# Patient Record
Sex: Male | Born: 1960 | Race: White | Hispanic: No | Marital: Married | Smoking: Former smoker
Health system: Southern US, Community
[De-identification: ages and names within clinical notes are randomized; demographics above are authoritative.]

## PROBLEM LIST (undated history)

## (undated) DIAGNOSIS — I10 Essential (primary) hypertension: Secondary | ICD-10-CM

## (undated) DIAGNOSIS — E559 Vitamin D deficiency, unspecified: Secondary | ICD-10-CM

## (undated) HISTORY — DX: Essential (primary) hypertension: I10

## (undated) HISTORY — PX: HIP ARTHROPLASTY: SHX981

## (undated) HISTORY — PX: KNEE ARTHROPLASTY: SHX992

## (undated) HISTORY — DX: Vitamin D deficiency, unspecified: E55.9

---

## 2001-11-19 ENCOUNTER — Inpatient Hospital Stay (HOSPITAL_COMMUNITY): Admission: EM | Admit: 2001-11-19 | Discharge: 2001-11-22 | Payer: Self-pay | Admitting: Plastic Surgery

## 2001-11-19 ENCOUNTER — Encounter: Payer: Self-pay | Admitting: Emergency Medicine

## 2001-11-19 ENCOUNTER — Encounter: Payer: Self-pay | Admitting: Orthopedic Surgery

## 2002-12-23 ENCOUNTER — Emergency Department (HOSPITAL_COMMUNITY): Admission: EM | Admit: 2002-12-23 | Discharge: 2002-12-23 | Payer: Self-pay | Admitting: *Deleted

## 2002-12-23 ENCOUNTER — Encounter: Payer: Self-pay | Admitting: *Deleted

## 2005-07-21 ENCOUNTER — Encounter (INDEPENDENT_AMBULATORY_CARE_PROVIDER_SITE_OTHER): Payer: Self-pay | Admitting: Family Medicine

## 2007-02-03 ENCOUNTER — Ambulatory Visit: Payer: Self-pay | Admitting: Family Medicine

## 2007-02-03 DIAGNOSIS — G43909 Migraine, unspecified, not intractable, without status migrainosus: Secondary | ICD-10-CM | POA: Insufficient documentation

## 2007-02-03 DIAGNOSIS — R42 Dizziness and giddiness: Secondary | ICD-10-CM | POA: Insufficient documentation

## 2007-02-03 DIAGNOSIS — R030 Elevated blood-pressure reading, without diagnosis of hypertension: Secondary | ICD-10-CM | POA: Insufficient documentation

## 2007-06-27 ENCOUNTER — Encounter (INDEPENDENT_AMBULATORY_CARE_PROVIDER_SITE_OTHER): Payer: Self-pay | Admitting: Family Medicine

## 2007-06-27 LAB — CONVERTED CEMR LAB
Cholesterol: 185 mg/dL
HDL: 40 mg/dL
LDL Cholesterol: 119 mg/dL
Triglycerides: 131 mg/dL

## 2007-07-17 ENCOUNTER — Ambulatory Visit: Payer: Self-pay | Admitting: Internal Medicine

## 2007-07-17 DIAGNOSIS — J309 Allergic rhinitis, unspecified: Secondary | ICD-10-CM | POA: Insufficient documentation

## 2007-07-28 ENCOUNTER — Ambulatory Visit: Payer: Self-pay | Admitting: Family Medicine

## 2007-07-28 LAB — CONVERTED CEMR LAB
Cholesterol, target level: 200 mg/dL
Cholesterol: 185 mg/dL
HDL goal, serum: 40 mg/dL
HDL: 40 mg/dL
LDL Cholesterol: 119 mg/dL
LDL Goal: 160 mg/dL
Triglycerides: 131 mg/dL

## 2007-07-31 ENCOUNTER — Telehealth (INDEPENDENT_AMBULATORY_CARE_PROVIDER_SITE_OTHER): Payer: Self-pay | Admitting: *Deleted

## 2007-07-31 ENCOUNTER — Encounter (INDEPENDENT_AMBULATORY_CARE_PROVIDER_SITE_OTHER): Payer: Self-pay | Admitting: Family Medicine

## 2007-08-01 ENCOUNTER — Telehealth (INDEPENDENT_AMBULATORY_CARE_PROVIDER_SITE_OTHER): Payer: Self-pay | Admitting: *Deleted

## 2007-08-01 LAB — CONVERTED CEMR LAB
ALT: 31 units/L (ref 0–53)
AST: 20 units/L (ref 0–37)
Albumin: 4.5 g/dL (ref 3.5–5.2)
Alkaline Phosphatase: 57 units/L (ref 39–117)
Bilirubin, Direct: 0.1 mg/dL (ref 0.0–0.3)
GGT: 63 units/L — ABNORMAL HIGH (ref 7–51)
Indirect Bilirubin: 0.4 mg/dL (ref 0.0–0.9)
Total Bilirubin: 0.5 mg/dL (ref 0.3–1.2)
Total Protein: 6.7 g/dL (ref 6.0–8.3)

## 2007-08-02 ENCOUNTER — Encounter (INDEPENDENT_AMBULATORY_CARE_PROVIDER_SITE_OTHER): Payer: Self-pay | Admitting: Family Medicine

## 2007-10-27 ENCOUNTER — Ambulatory Visit: Payer: Self-pay | Admitting: Family Medicine

## 2008-04-16 ENCOUNTER — Ambulatory Visit: Payer: Self-pay | Admitting: Family Medicine

## 2008-04-16 DIAGNOSIS — J069 Acute upper respiratory infection, unspecified: Secondary | ICD-10-CM | POA: Insufficient documentation

## 2008-07-26 ENCOUNTER — Telehealth (INDEPENDENT_AMBULATORY_CARE_PROVIDER_SITE_OTHER): Payer: Self-pay | Admitting: *Deleted

## 2008-12-24 ENCOUNTER — Encounter (INDEPENDENT_AMBULATORY_CARE_PROVIDER_SITE_OTHER): Payer: Self-pay | Admitting: Family Medicine

## 2010-05-07 NOTE — Progress Notes (Signed)
Summary: patinet needs Rx  Phone Note Call from Patient   Reason for Call: Talk to Nurse Summary of Call: patient is requesting a rx for nasonex to be called in. He said Dr Erby Pian said when he ran out to just call the office...  he uses [facility] in [place] if any problems please call his cell [phone] Initial call taken by: Donneta Romberg,  July 26, 2008 2:17 PM      Prescriptions: NASONEX 50 MCG/ACT  SUSP (MOMETASONE FUROATE) 2 sprays in each nostril once daily  #One month x 3   Entered by:   Sherilyn Banker LPN   Authorized by:   Franchot Heidelberg MD   Signed by:   Sherilyn Banker LPN on 09/81/1914   Method used:   Faxed to ...       [facility] [address] [phone]* (retail)       [address] [place]  [address]       Ph: [phone]       Fax: [phone]   RxID:   [id number]

## 2010-05-07 NOTE — Letter (Signed)
Summary: Results Letter  Results Letter   Imported By: Lutricia Horsfall 08/03/2007 15:03:54  _____________________________________________________________________  External Attachment:    Type:   Image     Comment:   External Document

## 2010-05-07 NOTE — Assessment & Plan Note (Signed)
Summary: FOLLOW UP 3 MONTH/SLJ   Vital Signs:  Patient Profile:   50 Years Old Male Height:     70 inches Weight:      182 pounds BMI:     26.21 O2 Sat:      99 % Temp:     97.1 degrees F Pulse rate:   82 / minute Resp:     12 per minute BP sitting:   132 / 81  Vitals Entered By: Sherilyn Banker (October 27, 2007 3:29 PM)                 PCP:  Franchot Heidelberg, MD  Chief Complaint:  follow up visit.  History of Present Illness: Pt in for recheck.  He states he has done well.   He states he has cut out Sudafed and states BP has done great. Also limiting OTC meds. He has done home readings at work with nurse and this is reviewed: - 127 - 136/ 74 - 96. He states he fee3s g66d. He denies tremor, weakness and has not had headaches. Notes no chest pain, shortness of breath. He denies palpitations, leg swelling. He is watching salt. He eats healthy. He is active and states works all the time.   He now presents.    Hypertension History:      Positive major cardiovascular risk factors include male age 78 years old or older.  Negative major cardiovascular risk factors include no history of diabetes, no history of hypertension, negative family history for ischemic heart disease, and non-tobacco-user status.        Further assessment for target organ damage reveals no history of ASHD, stroke/TIA, or peripheral vascular disease.    Lipid Management History:      Positive NCEP/ATP III risk factors include male age 72 years old or older.  Negative NCEP/ATP III risk factors include non-diabetic, no family history for ischemic heart disease, non-tobacco-user status, non-hypertensive, no ASHD (atherosclerotic heart disease), no prior stroke/TIA, no peripheral vascular disease, and no history of aortic aneurysm.        The patient states that he knows about the "Therapeutic Lifestyle Change" diet.  His compliance with the TLC diet is good.  The patient expresses understanding of adjunctive  measures for cholesterol lowering.  Adjunctive measures started by the patient include aerobic exercise, fiber, and omega-3 supplements.        Prior Medications Reviewed Using: Patient Recall  Updated Prior Medication List: NASONEX 50 MCG/ACT  SUSP (MOMETASONE FUROATE) 2 sprays in each nostril once daily  Current Allergies (reviewed today): No known allergies   Past Medical History:    Reviewed history from 07/17/2007 and no changes required:       Allergic rhinitis  Past Surgical History:    Reviewed history from 02/03/2007 and no changes required:       Hip fracture ORIF (2003) - fell of ladder - right       ACL Righ knee 1992       Hx of broken lef leg   Family History:    Reviewed history from 02/03/2007 and no changes required:       Father: alive @ 65 OA - needs hip replacement, BPH       Mother: alive @ 37 - CVA, HTN, COPD (hx of smoking), Borderline DM       Siblings: 2 sisters 63 and 34 & 1 brother 53 - HTN  Social History:    Reviewed history from 02/03/2007 and no  changes required:       Occupation: Buyer, retail       Married       Current Smoker       Alcohol use-no       Drug use-no   Risk Factors: Tobacco use:  quit    Pack-years:  2 packs per week - 15 years Drug use:  no Alcohol use:  no  Family History Risk Factors:    Family History of MI in females < 30 years old:  no    Family History of MI in males < 37 years old:  no   Review of Systems      See HPI  General      Denies chills, fever, and sweats.  CV      Denies chest pain or discomfort, swelling of feet, swelling of hands, and weight gain.  Resp      Denies cough, shortness of breath, sputum productive, and wheezing.  GI      Denies abdominal pain, constipation, diarrhea, nausea, and vomiting.  GU      Denies nocturia, urinary frequency, and urinary hesitancy.   Physical Exam  General:     alert, well-developed, and well-nourished.     Lungs:     Clear to auscultation bilaterally with good air movement, normal expansion.  Heart:     Normal rate and regular rhythm. S1 and S2 normal without gallop, murmur, click, rub or other extra sounds. Abdomen:     Bowel sounds positive,abdomen soft and non-tender without masses, organomegaly or hernias noted. Extremities:     No clubbing, cyanosis, edema, or deformity noted with normal full range of motion of all joints.   Cervical Nodes:     No lymphadenopathy noted Psych:     Cognition and judgment appear intact. Alert and cooperative with normal attention span and concentration. No apparent delusions, illusions, hallucinations    Impression & Recommendations:  Problem # 1:  ALLERGIC RHINITIS (ICD-477.9) Stable on Rx. Sampls given x 4. Update if needs more. Cont conservative measures with allegen avoidance if can, use of mask when mowing and chnaging home filters. Agrees. His updated medication list for this problem includes:    Nasonex 50 Mcg/act Susp (Mometasone furoate) .Marland Kitchen... 2 sprays in each nostril once daily   Problem # 2:  ELEVATED BLOOD PRESSURE WITHOUT DIAGNOSIS OF HYPERTENSION (ICD-796.2) Resolved. Avoid NSAIDs and Sudafed.  Problem # 3:  LIVER FUNCTION TESTS, ABNORMAL - GGT (ICD-794.8) Recheck 6 months.  Complete Medication List: 1)  Nasonex 50 Mcg/act Susp (Mometasone furoate) .... 2 sprays in each nostril once daily  Hypertension Assessment/Plan:      The patient's hypertensive risk group is category B: At least one risk factor (excluding diabetes) with no target organ damage.  His calculated 10 year risk of coronary heart disease is 7 %.  Today's blood pressure is 132/81.  His blood pressure goal is < 140/90.  Lipid Assessment/Plan:      Based on NCEP/ATP III, the patient's risk factor category is "0-1 risk factors".  From this information, the patient's calculated lipid goals are as follows: Total cholesterol goal is 200; LDL cholesterol goal is 160; HDL  cholesterol goal is 40; Triglyceride goal is 150.     Patient Instructions: 1)  Please schedule a follow-up appointment in 6 months.   ]

## 2010-05-07 NOTE — Miscellaneous (Signed)
Summary: H & P  H & P   Imported By: Donneta Romberg 02/07/2007 14:32:05  _____________________________________________________________________  External Attachment:    Type:   Image     Comment:   External Document

## 2010-05-07 NOTE — Assessment & Plan Note (Signed)
Summary: SORE THROAT AND CONGESTION/SLJ   Vital Signs:  Patient Profile:   50 Years Old Male Height:     70 inches Weight:      183 pounds BMI:     26.35 O2 Sat:      99 % Pulse rate:   80 / minute Resp:     12 per minute BP sitting:   137 / 84  Vitals Entered By: Sherilyn Banker (April 16, 2008 3:45 PM)                 PCP:  Franchot Heidelberg, MD  Chief Complaint:  sinus congestion, sore throat x 1week, and pt states that he is starting to feel better now..  History of Present Illness: Pt in for acute visit.  He states wife made apptfor him and states began feeling sick last Thursday. Had coughing and sneezing and started feeling better oin Monday. States had some sweats and chills. States feels a lot betetr than he did and adds used OTC Catering manager Gel Caps and some Nyquil. He states still tired and worn out but adds overworked with 70 to 80 hour weeks at [employer]. He has a dull headache and is mildly achy. States latter is over forehead. States about a 4/10. Worse with nothing and better with nothing - has not really treated. Needs refill on Nasonex.  He is sneezing and add sout of Rx. States 5 to6 times of sneezing this morning.  States green mucous. He does have PND and states throat was sore but not now. He does cough a little of same mucous. He denies chets pain, orthopnea, PND and palpitations. He has decreased apetite. He is not nauseated and denies vomitting. Notes sick people at work.   He did not get flu shot last year and did not get H1N1 vaccine. Had offered at work and states has been watching hand washing and covering cough.  He now presents.     Prior Medications Reviewed Using: Patient Recall  Updated Prior Medication List: NASONEX 50 MCG/ACT  SUSP (MOMETASONE FUROATE) 2 sprays in each nostril once daily  Current Allergies (reviewed today): No known allergies   Past Medical History:    Reviewed history from 07/17/2007 and no changes required:   Allergic rhinitis  Past Surgical History:    Reviewed history from 10/27/2007 and no changes required:       Hip fracture ORIF (2003) - fell of ladder - right       ACL Righ knee 1992       Hx of broken lef leg   Family History:    Reviewed history from 02/03/2007 and no changes required:       Father: alive @ 64 OA - needs hip replacement, BPH       Mother: alive @ 65 - CVA, HTN, COPD (hx of smoking), Borderline DM       Siblings: 2 sisters 29 and 29 & 1 brother 25 - HTN  Social History:    Reviewed history from 02/03/2007 and no changes required:       Occupation: Archivist and refrigeration       Married       Current Smoker       Alcohol use-no       Drug use-no    Review of Systems      See HPI   Physical Exam  General:     alert, well-developed, and well-nourished.   Head:  Normocephalic and atraumatic without obvious abnormalities. No apparent alopecia or balding. Eyes:     No corneal or conjunctival inflammation noted. EOMI. Perrla.  Ears:     External ear exam shows no significant lesions or deformities.  Otoscopic examination reveals clear canals, tympanic membranes are intact bilaterally without bulging, retraction, inflammation or discharge. Hearing is grossly normal bilaterally. Nose:     Mod congeston with clear liquid. Mouth:     Oral mucosa and oropharynx without lesions or exudates.   Neck:     No deformities, masses, or tenderness noted. Lungs:     Normal respiratory effort, chest expands symmetrically. Lungs are clear to auscultation, no crackles or wheezes. Heart:     Normal rate and regular rhythm. S1 and S2 normal without gallop, murmur, click, rub or other extra sounds. Abdomen:     Bowel sounds positive,abdomen soft and non-tender without masses, organomegaly or hernias noted. Extremities:     No clubbing, cyanosis, edema, or deformity noted with normal full range of motion of all joints.      Impression &  Recommendations:  Problem # 1:  UPPER RESPIRATORY INFECTION, VIRAL (ICD-465.9) Sx rx as is with fluid push, rest.Maay cont OTC Alk Seltzer and resume Nasonex. Update if signs worsen or persist.  Complete Medication List: 1)  Nasonex 50 Mcg/act Susp (Mometasone furoate) .... 2 sprays in each nostril once daily   Patient Instructions: 1)  Please schedule a follow-up appointment in 6 months.

## 2010-05-07 NOTE — Assessment & Plan Note (Signed)
Summary: NEW PATIENT/ARC   Vital Signs:  Patient Profile:   50 Years Old Male Height:     70 inches Weight:      182 pounds Pulse rate:   99 / minute Resp:     16 per minute BP sitting:   153 / 100  (left arm)  Pt. in pain?   no  Vitals Entered BySonny Dandy (February 03, 2007 3:47 PM)              Comments new patient, states he has Hx of vertigo. Took nyquil last pm for cold sx     PCP:  Franchot Heidelberg, MD  Chief Complaint:  initial visit.  History of Present Illness: Pt in to establish.  He was previously seen by Dr. Tiburcio Pea in [place].  He states his main concern today is sinus trouble. He states began 2 weeks ago. He is not sneezing. He has a stuffy nose, sore throat and ache over left forehead. Saw ENT in [place] and was told he had Vertigo and sinus infections. He has green mucous. He has fels sweaty and feverish at times. He has no ear pain. He denies cough.   His apetite is good today - poor yesterday. He has not had nasuea and vomitting. No diarrhea or constipation.   Some ill contacts at work. He has not self treated except for sinus medication he got at work - helped a little, not much.  He has not had flu-shot. States can get at work.  States his last full lab set was done at work.   Now presents.  Current Allergies: No known allergies   Past Surgical History:    Reviewed history and no changes required:       Hip fracture ORIF (2003) - fell of ladder - right       ACL Righ knee 1992       Hx of broken lef leg   Family History:    Reviewed history and no changes required:       Father: alive @ 34 OA - needs hip replacement, BPH       Mother: alive @ 30 - CVA, HTN, COPD (hx of smoking), Borderline DM       Siblings: 2 sisters 40 and 79 & 1 brother 28 - HTN  Social History:    Reviewed history and no changes required:       Occupation: Buyer, retail       Married       Current Smoker       Alcohol  use-no       Drug use-no   Risk Factors:  Tobacco use:  quit    Pack-years:  2 packs per week - 15 years Drug use:  no Alcohol use:  no   Review of Systems      See HPI  General      Denies chills, fever, and sweats.  CV      Denies chest pain or discomfort, swelling of feet, swelling of hands, and weight gain.  Resp      Denies chest discomfort, shortness of breath, sputum productive, and wheezing.  GI      Denies abdominal pain, constipation, diarrhea, nausea, and vomiting.  GU      Denies decreased libido, nocturia, urinary frequency, and urinary hesitancy.  Neuro      Denies brief paralysis, tremors, visual disturbances, and weakness.  Psych      Denies  anxiety and depression.  Endo      Denies cold intolerance, excessive hunger, excessive thirst, excessive urination, heat intolerance, polyuria, and weight change.   Physical Exam  General:     Well-developed,well-nourished,in no acute distress; alert,appropriate and cooperative throughout examination Head:     Normocephalic and atraumatic without obvious abnormalities. No apparent alopecia or balding. Tender left forehead Eyes:     No corneal or conjunctival inflammation noted. EOMI. Perrla. Vision grossly normal. Ears:     External ear exam shows no significant lesions or deformities.  Otoscopic examination reveals clear canals, tympanic membranes are intact bilaterally without bulging, retraction, inflammation or discharge. Hearing is grossly normal bilaterally. Nose:     External nasal examination shows right nares smaller than left. Significnat turbinate inflammation with green mucous bilateral nares. Mouth:     Oral mucosa and oropharynx without lesions or exudates.  Teeth in good repair. Neck:     No deformities, masses, or tenderness noted. Lungs:     Normal respiratory effort, chest expands symmetrically. Lungs are clear to auscultation, no crackles or wheezes. Heart:     Normal rate and regular  rhythm. S1 and S2 normal without gallop, murmur, click, rub or other extra sounds. Abdomen:     Bowel sounds positive,abdomen soft and non-tender without masses, organomegaly or hernias noted. Extremities:     No clubbing, cyanosis, edema, or deformity noted with normal full range of motion of all joints.   Neurologic:     No cranial nerve deficits noted. Station and gait are normal. Plantar reflexes are down-going bilaterally. DTRs are symmetrical throughout. Sensory, motor and coordinative functions appear intact. Skin:     Intact without suspicious lesions or rashes Cervical Nodes:     No lymphadenopathy noted Psych:     Cognition and judgment appear intact. Alert and cooperative with normal attention span and concentration. No apparent delusions, illusions, hallucinations    Impression & Recommendations:  Problem # 1:  SINUSITIS, FRONTAL, ACUTE (ICD-461.1) Discussed. Get old records. Start Zpak and use nasal saline for irrigation. Trial Xyzal for decongestion. Advised risk and benefit and samples given. He has very small right nares and I want to see what ENT said. Track frequency and consider sinus CT. Agrees. His updated medication list for this problem includes:    Zithromax Z-pak 250 Mg Tabs (Azithromycin) .Marland Kitchen... As directed   Problem # 2:  ELEVATED BLOOD PRESSURE WITHOUT DIAGNOSIS OF HYPERTENSION (ICD-796.2) New finding. Took Nyquil per report for congestion. Advised home BP checks and update in 3 months. Councelled diet, exersize and low salt intake. Advised eye exam and dental care. Agrees.  Problem # 3:  VERTIGO (ICD-780.4) Hx of. Stable. Get old records.  Problem # 4:  Preventive Health Care (ICD-V70.0) Advised flu-shot at work. Agreeable. He is set for full lab work at [employer] and will update me once he gets results ie. CBC, CMP, lipids, PSA, TSH.  Complete Medication List: 1)  Zithromax Z-pak 250 Mg Tabs (Azithromycin) .... As directed   Patient Instructions: 1)   Please schedule a follow-up appointment in 3 months.    Prescriptions: ZITHROMAX Z-PAK 250 MG  TABS (AZITHROMYCIN) As directed  #1 x 0   Entered and Authorized by:   Franchot Heidelberg MD   Signed by:   Franchot Heidelberg MD on 02/03/2007   Method used:   Print then Give to Patient   RxID:   [id number]  ]  Appended Document: NEW PATIENT/ARC records requested from Dr. Tiburcio Pea in [place]...12.09.08.Marland KitchenMarland Kitchenarc

## 2010-05-07 NOTE — Letter (Signed)
Summary: Records Release  Records Release   Imported By: Micah Flesher, LPN 16/01/9603 54:09:81  _____________________________________________________________________  External Attachment:    Type:   Image     Comment:   External Document

## 2010-05-07 NOTE — Progress Notes (Signed)
Summary: eye referral  Phone Note Outgoing Call   Call placed by: Sonny Dandy,  July 31, 2007 10:16 AM Summary of Call: Referral info given to patient. Dr Nile Riggs on 08/02/07 at 900am. Initial call taken by: Sonny Dandy,  July 31, 2007 10:17 AM

## 2010-05-07 NOTE — Assessment & Plan Note (Signed)
Summary: sinusitis/allergies   Vital Signs:  Patient Profile:   50 Years Old Male Height:     70 inches O2 Sat:      98 % O2 treatment:    Room Air Temp:     98.9 degrees F oral Pulse rate:   91 / minute Resp:     10 per minute BP sitting:   152 / 106  (right arm)  Vitals Entered By: Lutricia Horsfall (July 17, 2007 3:52 PM)                 Chief Complaint:  ?URI.  Acute Visit History:      The patient complains of cough, nasal discharge, sinus problems, and sore throat.  These symptoms began 2 weeks ago.  He denies fever.  Other comments include: He has been having sweats.  cough is worse in the mornings.  He has been using pseudofed.    He says he thinks this is both his allergies and some type of infection.        The character of the cough is described as Leisure centre manager .  There is no history of wheezing or shortness of breath associated with his cough.        He has had dysphagia associated with the sore throat.        He complains of nasal congestion.           Current Allergies: No known allergies   Past Medical History:    Allergic rhinitis      Physical Exam  General:     alert, well-developed, and well-nourished.   Ears:     Tympanic membranes clear.  Ear canals without erythema.  Nose:     Moderate Erythema and swelling with thick yellow drainage on left.  Mouth:     No eyrthema or exudate.  Neck:     no cervical lymphadenopathy.   Lungs:     Clear to auscultation bilaterally with good air movement, normal expansion.     Impression & Recommendations:  Problem # 1:  SINUSITIS, ACUTE (ICD-461.9) Given the duration of symptoms, we are going to treat with a Z-pak and Nasonex nasal spray His updated medication list for this problem includes:    Zithromax Z-pak 250 Mg Tabs (Azithromycin) .Marland Kitchen... As directed    Nasonex 50 Mcg/act Susp (Mometasone furoate) .Marland Kitchen... 2 sprays in each nostril once daily   Problem # 2:  ALLERGIC RHINITIS  (ICD-477.9) I asked him to let me know if he would like to continue the nasonex for his allergies. His updated medication list for this problem includes:    Nasonex 50 Mcg/act Susp (Mometasone furoate) .Marland Kitchen... 2 sprays in each nostril once daily   Problem # 3:  ELEVATED BLOOD PRESSURE WITHOUT DIAGNOSIS OF HYPERTENSION (ICD-796.2) He has been instructed to avoid Sudafed and phenylephrine.  He will follow up with Dr. Erby Pian for this.  Complete Medication List: 1)  Zithromax Z-pak 250 Mg Tabs (Azithromycin) .... As directed 2)  Nasonex 50 Mcg/act Susp (Mometasone furoate) .... 2 sprays in each nostril once daily   Patient Instructions: 1)  Next Appointment: 07/28/2007,3:15 pm,OFFICE VISIT,RMA,FERREIRA    Prescriptions: NASONEX 50 MCG/ACT  SUSP (MOMETASONE FUROATE) 2 sprays in each nostril once daily  #28 doses x 0   Entered and Authorized by:   Erle Crocker MD   Signed by:   Erle Crocker MD on 07/17/2007   Method used:   Samples Given   RxID:   [id number] [facility]  Z-PAK 250 MG  TABS (AZITHROMYCIN) As directed  #1 x 0   Entered and Authorized by:   Erle Crocker MD   Signed by:   Erle Crocker MD on 07/17/2007   Method used:   Print then Give to Patient   RxID:   [id number]  ]

## 2010-05-07 NOTE — Progress Notes (Signed)
Summary: 07/31/07 lab results letter mailed  Phone Note Outgoing Call   Call placed by: Sonny Dandy,  August 01, 2007 9:04 AM Summary of Call: message left for patient to return call on answering machine .................................................................Marland KitchenMarland KitchenSonny Dandy  August 01, 2007 9:04 AM   called, no answer, letter mailed Sonny Dandy  August 02, 2007 8:13 AM   Initial call taken by: Sonny Dandy,  August 02, 2007 8:13 AM         Appended Document: 07/31/07 lab results letter mailed patient called and results given

## 2010-05-07 NOTE — Letter (Signed)
Summary: Records from [facility]  Records from [facility]   Imported By: Lutricia Horsfall 03/29/2007 16:43:33  _____________________________________________________________________  External Attachment:    Type:   Image     Comment:   External Document

## 2010-05-07 NOTE — Assessment & Plan Note (Signed)
Summary: FOLLOW UP ON LABS/ARC   Vital Signs:  Patient Profile:   50 Years Old Male Height:     70 inches Weight:      182 pounds BMI:     26.21 Pulse rate:   92 / minute BP sitting:   137 / 89  (left arm)  Vitals Entered By: Sonny Dandy (July 28, 2007 3:15 PM)                 PCP:  Franchot Heidelberg, MD  Chief Complaint:  Lab follow up.  History of Present Illness: Pt in for recheck.  He saw Dr. Jen Mow recently for sinus sx and allergic rhinitis. He used Zpak and Nasonex and he notes has helped a lot. He states he would really like refill on Nasonex. He denies headaches, blurry vision., sore thorat, runny nose. He denies sneezing. He denies itchy eyes and watery eyes. He states he is seeing some age related eye changes. He notes right eye blurry vision over last several years and states cannot read up close.  States cannot read up close. Requests eye exam. He had high BP lst visit and states was related to Sudafed.   He had labs at work.  This is reviewed:  1. CBC - normal 2. CMP - normal 3. Lipids - TC 185, Trig 131, HDL 40, LDL 119 4. PSA 0.6 5. TSH -0.59 7. GGT - 71 - denies ETOH use. Not using OTC meds anymore.  He now presents.  Hypertension History:      He denies headache, chest pain, palpitations, dyspnea with exertion, orthopnea, PND, peripheral edema, visual symptoms, neurologic problems, syncope, and side effects from treatment.  Further comments include: Watching salt. Exersizing.        Positive major cardiovascular risk factors include male age 45 years old or older.  Negative major cardiovascular risk factors include no history of diabetes, no history of hypertension, negative family history for ischemic heart disease, and non-tobacco-user status.        Further assessment for target organ damage reveals no history of ASHD, stroke/TIA, or peripheral vascular disease.    Lipid Management History:      Positive NCEP/ATP III risk factors include male age 55  years old or older.  Negative NCEP/ATP III risk factors include non-diabetic, no family history for ischemic heart disease, non-tobacco-user status, non-hypertensive, no ASHD (atherosclerotic heart disease), no prior stroke/TIA, no peripheral vascular disease, and no history of aortic aneurysm.        Updated Prior Medication List: NASONEX 50 MCG/ACT  SUSP (MOMETASONE FUROATE) 2 sprays in each nostril once daily  Current Allergies (reviewed today): No known allergies   Past Medical History:    Reviewed history from 07/17/2007 and no changes required:       Allergic rhinitis  Past Surgical History:    Reviewed history from 02/03/2007 and no changes required:       Hip fracture ORIF (2003) - fell of ladder - right       ACL Righ knee 1992       Hx of broken lef leg   Family History:    Reviewed history from 02/03/2007 and no changes required:       Father: alive @ 62 OA - needs hip replacement, BPH       Mother: alive @ 50 - CVA, HTN, COPD (hx of smoking), Borderline DM       Siblings: 2 sisters 6 and 10 & 1 brother 70 -  HTN  Social History:    Reviewed history from 02/03/2007 and no changes required:       Occupation: Buyer, retail       Married       Current Smoker       Alcohol use-no       Drug use-no   Risk Factors: Tobacco use:  quit    Pack-years:  2 packs per week - 15 years Drug use:  no Alcohol use:  no  Family History Risk Factors:    Family History of MI in females < 7 years old:  no    Family History of MI in males < 2 years old:  no   Review of Systems      See HPI  General      Denies chills, fever, and sweats.  CV      Denies chest pain or discomfort, swelling of feet, swelling of hands, and weight gain.  Resp      Denies cough, pleuritic, shortness of breath, sputum productive, and wheezing.  GI      Denies abdominal pain, constipation, diarrhea, nausea, and vomiting.  GU      Denies nocturia,  urinary frequency, and urinary hesitancy.   Physical Exam  General:     alert, well-developed, and well-nourished.   Head:     Normocephalic and atraumatic without obvious abnormalities. No apparent alopecia or balding. Eyes:     No corneal or conjunctival inflammation noted. EOMI. Perrla.  Ears:     External ear exam shows no significant lesions or deformities.  Otoscopic examination reveals clear canals, tympanic membranes are intact bilaterally without bulging, retraction, inflammation or discharge. Hearing is grossly normal bilaterally. Nose:     External nasal examination shows no deformity or inflammation. Nasal mucosa are pink and moist without lesions or exudates. Mouth:     Oral mucosa and oropharynx without lesions or exudates.  Teeth in good repair. Neck:     No deformities, masses, or tenderness noted. Lungs:     Clear to auscultation bilaterally with good air movement, normal expansion.  Heart:     Normal rate and regular rhythm. S1 and S2 normal without gallop, murmur, click, rub or other extra sounds. Abdomen:     Bowel sounds positive,abdomen soft and non-tender without masses, organomegaly or hernias noted. Extremities:     No clubbing, cyanosis, edema, or deformity noted with normal full range of motion of all joints.   Psych:     Cognition and judgment appear intact. Alert and cooperative with normal attention span and concentration. No apparent delusions, illusions, hallucinations    Impression & Recommendations:  Problem # 1:  ALLERGIC RHINITIS (ICD-477.9) Discussed. Much improved. Refill Nasonex and discount voucher given as he lost Rx. Councelled script safety. His updated medication list for this problem includes:    Nasonex 50 Mcg/act Susp (Mometasone furoate) .Marland Kitchen... 2 sprays in each nostril once daily   Problem # 2:  SINUSITIS, ACUTE (ICD-461.9) Resolved. Follow nd update if worse. The following medications were removed from the medication list:     Zithromax Z-pak 250 Mg Tabs (Azithromycin) .Marland Kitchen... As directed  His updated medication list for this problem includes:    Nasonex 50 Mcg/act Susp (Mometasone furoate) .Marland Kitchen... 2 sprays in each nostril once daily   Problem # 3:  ELEVATED BLOOD PRESSURE WITHOUT DIAGNOSIS OF HYPERTENSION (ICD-796.2) Discussed. Resolved. Avoid Sudafed. Councelled diet, exersize and low salt intake. Advised home readings and recheck 3  months.  Problem # 4:  LIVER FUNCTION TESTS, ABNORMAL - GGT (ICD-794.8) Repeat and if abnormal will need further eval. Discused broad range of causes and reviewed improtance of lab. Agrees. Orders: T- * Misc. Laboratory test [phone]) Venipuncture [phone])   Complete Medication List: 1)  Nasonex 50 Mcg/act Susp (Mometasone furoate) .... 2 sprays in each nostril once daily  Other Orders: Ophthalmology Referral (Ophthalmology)  Hypertension Assessment/Plan:      The patient's hypertensive risk group is category B: At least one risk factor (excluding diabetes) with no target organ damage.  His calculated 10 year risk of coronary heart disease is 7 %.  Today's blood pressure is 137/89.  His blood pressure goal is < 140/90.  Lipid Assessment/Plan:      Based on NCEP/ATP III, the patient's risk factor category is "0-1 risk factors".  From this information, the patient's calculated lipid goals are as follows: Total cholesterol goal is 200; LDL cholesterol goal is 160; HDL cholesterol goal is 40; Triglyceride goal is 150.     Patient Instructions: 1)  Please schedule a follow-up appointment in 3 months.    Prescriptions: NASONEX 50 MCG/ACT  SUSP (MOMETASONE FUROATE) 2 sprays in each nostril once daily  #One month x 3   Entered and Authorized by:   Franchot Heidelberg MD   Signed by:   Franchot Heidelberg MD on 07/28/2007   Method used:   Print then Give to Patient   RxID:   [id number]  ]

## 2010-08-21 NOTE — Discharge Summary (Signed)
NAME:  Alejandro Campbell, Alejandro Campbell NO.:  [id number]   MEDICAL RECORD NO.:  [id number]                   PATIENT TYPE:  INP   LOCATION:  5739                                 FACILITY:  [facility]   PHYSICIAN:  Harvie Junior, M.D.                DATE OF BIRTH:  09/22/1960   DATE OF ADMISSION:  11/19/2001  DATE OF DISCHARGE:  11/22/2001                                 DISCHARGE SUMMARY   ADMITTING DIAGNOSIS:  Intertrochanteric fracture, right hip, secondary to  fall.   DISCHARGE DIAGNOSIS:  Intertrochanteric fracture, right hip, secondary to  fall.   PROCEDURES IN HOSPITAL:  Open reduction internal fixation right hip, Jodi Geralds, M.D., November 19, 2001.   BRIEF HISTORY:  Alejandro Campbell is a 50 year old male who fell off a ladder and  complained of pain in the right hip area.  He denied any numbness or  tingling.  He came to the [facility] Emergency Room, where x-rays  of the pelvis and right hip showed a right intertrochanteric hip fracture  with minimal displacement.  He was unable to weight-bear secondary to right  hip pain and was admitted for open reduction internal fixation of his right  hip fracture.   PERTINENT LABORATORY STUDIES:  EKG on admission showed normal sinus rhythm.  Interoperative CM fluoroscopy showed ORIF of the right femoral neck fracture  with anatomic alignment.  Preoperative x-ray of the pelvis and right hip  showed intertrochanteric fracture of the right hip.  No active  cardiopulmonary disease was noted on the chest x-ray.  CT of the head  without contrast showed no acute intracranial abnormality evident.  There  was indistinct area of the fourth ventricle believed to be incidental rather  than due to a pathologic process, but the patient has posterior fossa  symptoms.  MRI may be wanted to be done for further evaluation.  There was  no discreetly visible fourth ventricle, in itself to be incidental.  It was  noted by the radiologist  that if the patient had dizziness, vertigo, or  potential posterior fossa symptoms, an MRI of the brain will be suggested.  Hemoglobin on admission was 16, hematocrit 46.0.  On post-op day #1,  hemoglobin was 11.5, and on post-op day #2 was 11.0.  Pro time on admission  was 13.1 with an INR of 0.9.  PTT was 26.  Pro time on date of discharge was  17.8 seconds with an INR of 1.6.  B-NET on admission showed no  abnormalities.  Urinalysis on admission showed no abnormalities.   HOSPITAL COURSE:  Patient was admitted to the emergency room with right hip  pain.  He was taken to the operating room for an ORIF of his right hip, as  well-described on Dr. Luiz Blare' operative note.  Postoperatively, he was put  on IV Ancef 1 g q.8h. x5 doses.  He was  also started on Coumadin and  antithrombin loop therapy for problems per protocol, and physical therapy  was ordered for walker ambulation, 50% weightbearing on the right side.  On  postoperative day #1, he had no complaints.  The vital signs were stable.  He was afebrile.  His right hip dressing was clean and dry.  On  postoperative day #2, he had no complaints.  He had no difficulty urinating.  He had no dizziness or lightheadedness.  He had a low-grade fever of 100.6.  Hemoglobin was 11.  INR was 1.4.  Urinalysis was negative for blood.  His IV  was discontinued.  On postoperative day #3, he was discharged home.  He was  eating and voiding without difficulty.  He did well with physical therapy.  His vital signs were stable.  He was afebrile, and his right hip wound was  benign.  His calf was soft.  His INR was 1.6.  He was discharged home in  approved condition on a regular diet.  He may ambulate 50% weightbearing on  the right with crutches.  He was  given Rx for Lortab 10 mg p.r.n. for pain and Coumadin x1 month  postoperative DVT prophylaxis.  He will see Dr. Luiz Blare in the office in 2  weeks.  It may be prudent for this patient to see either Dr.  Tiburcio Pea or Dr.  Haroldine Laws to consider getting an MRI scan, based upon his CT scan findings of  his head, but we will leave that up to him.     Currie Paris Thedore Mins.                    Harvie Junior, M.D.    JGB/MEDQ  D:  01/03/2002  T:  01/08/2002  Job:  [id number]   cc:   Holley Bouche, M.D.  [address]  [place], [place] [address]  Fax: [phone]   Keturah Barre, M.D.

## 2010-08-21 NOTE — Op Note (Signed)
NAME:  Alejandro Campbell, Alejandro Campbell NO.:  [id number]   MEDICAL RECORD NO.:  [id number]                   PATIENT TYPE:  INP   LOCATION:  5739                                 FACILITY:  [facility]   PHYSICIAN:  Harvie Junior, M.D.                DATE OF BIRTH:  08-02-1960   DATE OF PROCEDURE:  11/19/2001  DATE OF DISCHARGE:  11/22/2001                                 OPERATIVE REPORT   PREOPERATIVE DIAGNOSES:  Right intertrochanteric hip fracture.   POSTOPERATIVE DIAGNOSES:  Right intertrochanteric hip fracture.   PROCEDURE:  Open reduction, internal fixation of right intertrochanteric hip  fracture.   SURGEON:  Harvie Junior, M.D.   ASSISTANT:  Currie Paris. Thedore Mins.   ANESTHESIA:  General.   BRIEF HISTORY AND PHYSICAL:  He is a 50 year old male who was up on a ladder  and fell.  He suffered an intertrochanteric hip fracture.  He was evaluated  in the emergency room and noted to have the same.  He was evaluated  medically preoperatively and felt to be an operative candidate.  He was  taken to the operating room for open reduction, internal fixation of right  hip fracture.   PROCEDURE:  The patient was brought to the operating room.  After adequate  anesthesia ________ patient placed on the operating table, then moved on the  fracture table.  AP and lateral images were obtained with an anatomic  reduction of the fracture.  At this point, the hip was prepped and draped in  the usual sterile fashion.  A standard incision was made for a lateral  approach to the hip.  The IT band was divided.  The vastus lateralis muscle  was dissected posteriorly back to the linea aspera.  The muscle was then  unfolded forward.  The guide wire was placed in the central portion of the  head with AP and lateral under fluoroscopic guidance.  A cannulated screw  was then placed in the neck and attached with a 4-0 hole side plate under  standard AO fashion.  At this point, a  compression technique was used to  compress the fracture site.  The wound at this point was then copiously  irrigated and suctioned dry.  The vastus lateralis fascia was then closed  with 0 Vicryl running suture, the tensor fascia with a 1 Vicryl running  suture, the subcu with 0 and 2-0 Vicryl, and the skin with skin staples.  A  sterile compressive dressing was applied.  The patient was taken to recovery  and was noted to be in satisfactory condition. ________ 250 cc.                                               Alejandro Campbell, M.D.    Ranae Plumber  D:  12/11/2001  T:  12/11/2001  Job:  [id number]

## 2013-12-18 ENCOUNTER — Ambulatory Visit (INDEPENDENT_AMBULATORY_CARE_PROVIDER_SITE_OTHER): Payer: BLUE CROSS/BLUE SHIELD | Admitting: Cardiology

## 2013-12-18 ENCOUNTER — Encounter: Payer: Self-pay | Admitting: Cardiology

## 2013-12-18 VITALS — BP 166/99 | HR 86 | Ht 71.0 in | Wt 190.0 lb

## 2013-12-18 DIAGNOSIS — R002 Palpitations: Secondary | ICD-10-CM

## 2013-12-18 NOTE — Patient Instructions (Signed)
There were no changes to your medications. Continue as directed. Follow up as needed.

## 2013-12-18 NOTE — Progress Notes (Signed)
       Clinical Summary Mr. Colarusso is a 53 y.o.male 1. Palpitations - notes occasional fluttering feeling. Starting approx 2 months ago, 3 total episodes. Can occur at rest or with exertion. Lasted just just a few seconds. No other associated symptoms. - no coffee, tea 3-4 glasses, no energy drinks, no sodas, only occas EtoH - no DOE,no chest pain   Patient denies any PMH   No Known Allergies   No current outpatient prescriptions on file.   No current facility-administered medications for this visit.     No past surgical history on file.   No Known Allergies    No family history on file.   Social History Mr. Danzer reports that he quit smoking about 15 years ago. He does not have any smokeless tobacco history on file. Mr. Wyndham has no alcohol history on file.   Review of Systems CONSTITUTIONAL: No weight loss, fever, chills, weakness or fatigue.  HEENT: Eyes: No visual loss, blurred vision, double vision or yellow sclerae.No hearing loss, sneezing, congestion, runny nose or sore throat.  SKIN: No rash or itching.  CARDIOVASCULAR: per HPI RESPIRATORY: No shortness of breath, cough or sputum.  GASTROINTESTINAL: No anorexia, nausea, vomiting or diarrhea. No abdominal pain or blood.  GENITOURINARY: No burning on urination, no polyuria NEUROLOGICAL: No headache, dizziness, syncope, paralysis, ataxia, numbness or tingling in the extremities. No change in bowel or bladder control.  MUSCULOSKELETAL: No muscle, back pain, joint pain or stiffness.  LYMPHATICS: No enlarged nodes. No history of splenectomy.  PSYCHIATRIC: No history of depression or anxiety.  ENDOCRINOLOGIC: No reports of sweating, cold or heat intolerance. No polyuria or polydipsia.  Marland Kitchen   Physical Examination Filed Vitals:   12/18/13 1456  BP: 166/99  Pulse: 86   Filed Weights   12/18/13 1456  Weight: 190 lb (86.183 kg)    Gen: resting comfortably, no acute distress HEENT: no scleral icterus, pupils  equal round and reactive, no palptable cervical adenopathy,  CV Resp: Clear to auscultation bilaterally GI: abdomen is soft, non-tender, non-distended, normal bowel sounds, no hepatosplenomegaly MSK: extremities are warm, no edema.  Skin: warm, no rash Neuro:  no focal deficits Psych: appropriate affect   Diagnostic Studies  12/18/13 EKG NSR, LAE   Assessment and Plan  1. Palpitations - mild, infrequent symptoms with no other cardiac symptoms - likely benign ectopy, potentially caused by heavy caffeine intake (3-4 glasses of tea daily) - counseled on decreased caffeine intake, if symptoms progress can consider further workup at that time - will request labs from Dr Manon Hilding office including TSH   F/u as needed   Antoine Poche, M.D.,

## 2013-12-21 ENCOUNTER — Ambulatory Visit: Payer: Self-pay | Admitting: Cardiology

## 2016-03-31 DIAGNOSIS — R5383 Other fatigue: Secondary | ICD-10-CM | POA: Diagnosis not present

## 2016-03-31 DIAGNOSIS — R6882 Decreased libido: Secondary | ICD-10-CM | POA: Diagnosis not present

## 2016-03-31 DIAGNOSIS — Z0000 Encounter for general adult medical examination without abnormal findings: Secondary | ICD-10-CM | POA: Diagnosis not present

## 2016-03-31 DIAGNOSIS — E559 Vitamin D deficiency, unspecified: Secondary | ICD-10-CM | POA: Diagnosis not present

## 2016-03-31 DIAGNOSIS — I10 Essential (primary) hypertension: Secondary | ICD-10-CM | POA: Diagnosis not present

## 2016-04-12 DIAGNOSIS — J3089 Other allergic rhinitis: Secondary | ICD-10-CM | POA: Diagnosis not present

## 2016-04-12 DIAGNOSIS — E785 Hyperlipidemia, unspecified: Secondary | ICD-10-CM | POA: Diagnosis not present

## 2016-04-12 DIAGNOSIS — E559 Vitamin D deficiency, unspecified: Secondary | ICD-10-CM | POA: Diagnosis not present

## 2016-04-12 DIAGNOSIS — E291 Testicular hypofunction: Secondary | ICD-10-CM | POA: Diagnosis not present

## 2016-04-12 DIAGNOSIS — I10 Essential (primary) hypertension: Secondary | ICD-10-CM | POA: Diagnosis not present

## 2016-08-13 DIAGNOSIS — Z125 Encounter for screening for malignant neoplasm of prostate: Secondary | ICD-10-CM | POA: Diagnosis not present

## 2016-08-13 DIAGNOSIS — I10 Essential (primary) hypertension: Secondary | ICD-10-CM | POA: Diagnosis not present

## 2016-08-13 DIAGNOSIS — R7301 Impaired fasting glucose: Secondary | ICD-10-CM | POA: Diagnosis not present

## 2016-08-13 DIAGNOSIS — E782 Mixed hyperlipidemia: Secondary | ICD-10-CM | POA: Diagnosis not present

## 2016-08-21 DIAGNOSIS — I10 Essential (primary) hypertension: Secondary | ICD-10-CM | POA: Diagnosis not present

## 2016-08-21 DIAGNOSIS — R7301 Impaired fasting glucose: Secondary | ICD-10-CM | POA: Diagnosis not present

## 2016-08-21 DIAGNOSIS — Z0000 Encounter for general adult medical examination without abnormal findings: Secondary | ICD-10-CM | POA: Diagnosis not present

## 2016-08-21 DIAGNOSIS — Z6828 Body mass index (BMI) 28.0-28.9, adult: Secondary | ICD-10-CM | POA: Diagnosis not present

## 2016-10-02 DIAGNOSIS — Z96641 Presence of right artificial hip joint: Secondary | ICD-10-CM | POA: Diagnosis not present

## 2016-10-02 DIAGNOSIS — M25551 Pain in right hip: Secondary | ICD-10-CM | POA: Diagnosis not present

## 2016-10-21 DIAGNOSIS — G8929 Other chronic pain: Secondary | ICD-10-CM | POA: Diagnosis not present

## 2016-10-21 DIAGNOSIS — Z96641 Presence of right artificial hip joint: Secondary | ICD-10-CM | POA: Diagnosis not present

## 2016-10-21 DIAGNOSIS — M25551 Pain in right hip: Secondary | ICD-10-CM | POA: Diagnosis not present

## 2016-10-21 DIAGNOSIS — M7631 Iliotibial band syndrome, right leg: Secondary | ICD-10-CM | POA: Diagnosis not present

## 2017-03-02 DIAGNOSIS — E785 Hyperlipidemia, unspecified: Secondary | ICD-10-CM | POA: Diagnosis not present

## 2017-03-02 DIAGNOSIS — Z0000 Encounter for general adult medical examination without abnormal findings: Secondary | ICD-10-CM | POA: Diagnosis not present

## 2017-03-02 DIAGNOSIS — E559 Vitamin D deficiency, unspecified: Secondary | ICD-10-CM | POA: Diagnosis not present

## 2017-03-02 DIAGNOSIS — E291 Testicular hypofunction: Secondary | ICD-10-CM | POA: Diagnosis not present

## 2017-03-02 DIAGNOSIS — I10 Essential (primary) hypertension: Secondary | ICD-10-CM | POA: Diagnosis not present

## 2017-03-02 DIAGNOSIS — Z125 Encounter for screening for malignant neoplasm of prostate: Secondary | ICD-10-CM | POA: Diagnosis not present

## 2017-03-05 DIAGNOSIS — T18120A Food in esophagus causing compression of trachea, initial encounter: Secondary | ICD-10-CM | POA: Diagnosis not present

## 2017-06-07 DIAGNOSIS — I10 Essential (primary) hypertension: Secondary | ICD-10-CM | POA: Diagnosis not present

## 2017-06-07 DIAGNOSIS — R5383 Other fatigue: Secondary | ICD-10-CM | POA: Diagnosis not present

## 2017-06-07 DIAGNOSIS — R6882 Decreased libido: Secondary | ICD-10-CM | POA: Diagnosis not present

## 2017-07-20 DIAGNOSIS — R6882 Decreased libido: Secondary | ICD-10-CM | POA: Diagnosis not present

## 2017-07-20 DIAGNOSIS — I10 Essential (primary) hypertension: Secondary | ICD-10-CM | POA: Diagnosis not present

## 2017-07-20 DIAGNOSIS — R5383 Other fatigue: Secondary | ICD-10-CM | POA: Diagnosis not present

## 2017-10-13 DIAGNOSIS — R05 Cough: Secondary | ICD-10-CM | POA: Diagnosis not present

## 2017-10-13 DIAGNOSIS — I10 Essential (primary) hypertension: Secondary | ICD-10-CM | POA: Diagnosis not present

## 2017-10-13 DIAGNOSIS — R6882 Decreased libido: Secondary | ICD-10-CM | POA: Diagnosis not present

## 2017-11-28 DIAGNOSIS — R1013 Epigastric pain: Secondary | ICD-10-CM | POA: Diagnosis not present

## 2017-11-28 DIAGNOSIS — I10 Essential (primary) hypertension: Secondary | ICD-10-CM | POA: Diagnosis not present

## 2018-01-04 DIAGNOSIS — R100 Acute abdomen: Secondary | ICD-10-CM | POA: Diagnosis not present

## 2018-01-04 DIAGNOSIS — I10 Essential (primary) hypertension: Secondary | ICD-10-CM | POA: Diagnosis not present

## 2018-03-16 DIAGNOSIS — R5383 Other fatigue: Secondary | ICD-10-CM | POA: Diagnosis not present

## 2018-03-16 DIAGNOSIS — Z0000 Encounter for general adult medical examination without abnormal findings: Secondary | ICD-10-CM | POA: Diagnosis not present

## 2018-03-16 DIAGNOSIS — Z125 Encounter for screening for malignant neoplasm of prostate: Secondary | ICD-10-CM | POA: Diagnosis not present

## 2018-03-16 DIAGNOSIS — I10 Essential (primary) hypertension: Secondary | ICD-10-CM | POA: Diagnosis not present

## 2018-03-16 DIAGNOSIS — E785 Hyperlipidemia, unspecified: Secondary | ICD-10-CM | POA: Diagnosis not present

## 2018-03-16 DIAGNOSIS — E559 Vitamin D deficiency, unspecified: Secondary | ICD-10-CM | POA: Diagnosis not present

## 2018-03-27 DIAGNOSIS — X58XXXA Exposure to other specified factors, initial encounter: Secondary | ICD-10-CM | POA: Diagnosis not present

## 2018-03-27 DIAGNOSIS — T189XXA Foreign body of alimentary tract, part unspecified, initial encounter: Secondary | ICD-10-CM | POA: Diagnosis not present

## 2018-03-27 DIAGNOSIS — Y999 Unspecified external cause status: Secondary | ICD-10-CM | POA: Diagnosis not present

## 2018-03-28 DIAGNOSIS — J029 Acute pharyngitis, unspecified: Secondary | ICD-10-CM | POA: Diagnosis not present

## 2018-07-20 ENCOUNTER — Ambulatory Visit (INDEPENDENT_AMBULATORY_CARE_PROVIDER_SITE_OTHER): Payer: BLUE CROSS/BLUE SHIELD | Admitting: Nurse Practitioner

## 2019-02-13 ENCOUNTER — Other Ambulatory Visit (INDEPENDENT_AMBULATORY_CARE_PROVIDER_SITE_OTHER): Payer: Self-pay | Admitting: Internal Medicine

## 2019-03-16 ENCOUNTER — Telehealth (INDEPENDENT_AMBULATORY_CARE_PROVIDER_SITE_OTHER): Payer: Managed Care, Other (non HMO) | Admitting: Nurse Practitioner

## 2019-03-16 ENCOUNTER — Other Ambulatory Visit: Payer: Self-pay

## 2019-03-16 ENCOUNTER — Encounter (INDEPENDENT_AMBULATORY_CARE_PROVIDER_SITE_OTHER): Payer: Self-pay | Admitting: Nurse Practitioner

## 2019-03-16 DIAGNOSIS — I10 Essential (primary) hypertension: Secondary | ICD-10-CM | POA: Diagnosis not present

## 2019-03-16 DIAGNOSIS — E559 Vitamin D deficiency, unspecified: Secondary | ICD-10-CM | POA: Insufficient documentation

## 2019-03-16 MED ORDER — CHLORTHALIDONE 25 MG PO TABS: 25.0000 mg | ORAL_TABLET | Freq: Every day | ORAL | 0 refills | Status: DC

## 2019-03-16 NOTE — Assessment & Plan Note (Signed)
I will change him from hydrochlorothiazide to chlorthalidone.  He was encouraged to continue monitoring his blood pressures while at home, and he will follow-up in 3 weeks at which time he will need his basic metabolic panel rechecked, and he will be due for annual physical exam.  We will most likely collect all blood work at that time.  We also discussed common and serious adverse reactions associated with chlorthalidone.  I did warn him about possible Stevens-Johnson syndrome, pancreatitis, renal failure, electrolyte abnormalities, dizziness, and photosensitivity.  I encouraged him to maintain proper hydration while taking this medication and that if he were to develop a rash will take this medication especially if he also develops any mucosal discomfort without rash he needs to stop it right away.  He tells me he understands.

## 2019-03-16 NOTE — Progress Notes (Signed)
Due to national recommendations of social distancing related to the [place] pandemic, an audio/visual tele-health visit was felt to be the most appropriate encounter type for this patient today. I connected with  Alejandro Campbell on 03/16/19 utilizing audio-only technology and verified that I am speaking with the correct person using two identifiers. The patient was located at their home, and I was located at my home office during the encounter. I discussed the limitations of evaluation and management by telemedicine. The patient expressed understanding and agreed to proceed.      Subjective:  Patient ID: Alejandro Campbell, male    DOB: 15-Apr-1960  Age: 58 y.o. MRN: [id number]  CC:  Chief Complaint  Patient presents with  . Follow-up      HPI  This patient presents today for a virtual office visit.  He is almost out of his antihypertensive medication (hydrochlorothiazide) and was told that this medication would not be refilled if he did not have an office visit.  It appears that we have not seen this patient since December 2019 for his annual physical exam.  Per chart review his last metabolic panel was collected in December 2019 and was within normal limits.  He tells me he did not check his blood pressure today, but he checks it periodically at home.  He reports that his blood pressure has been running anywhere between 145-150/1 100-108.  He tells me that he is going to run out of his hydrochlorothiazide today, but is at home blood pressures have been those values while on his medication.  Past Medical History:  Diagnosis Date  . Hypertension   . Vitamin D deficiency       Family History  Problem Relation Age of Onset  . Hypertension Mother   . Hyperlipidemia Mother   . CVA Mother   . Renal Disease Father        ESRD  . Hypertension Brother     Social History   Social History Narrative  . Not on file   Social History   Tobacco Use  . Smoking status: Former Smoker    Quit  date: 04/05/1998    Years since quitting: 20.9  . Smokeless tobacco: Never Used  Substance Use Topics  . Alcohol use: Not on file    Comment: occasional     No outpatient medications have been marked as taking for the 03/16/19 encounter (Telemedicine) with Ailene Ards, NP.    ROS:  Review of Systems  Constitutional: Negative.   Respiratory: Negative.   Cardiovascular: Negative.      Objective:   Today's Vitals: There were no vitals taken for this visit. Vitals with BMI 03/16/2019 12/18/2013 04/16/2008  Height - 5\' 11"  -  Weight - 190 lbs 183 lbs  BMI - 24.4 -  Systolic (No Data) 010 272  Diastolic (No Data) 99 84  Pulse - 86 80     Physical Exam Comprehensive physical exam not completed today as office visit was conducted remotely.  Patient did sound well over the phone.  He was alert oriented and exhibited proper judgment.      Assessment   1. Essential hypertension       Tests ordered No orders of the defined types were placed in this encounter.    Plan: Please see assessment and plan per problem list below.   Meds ordered this encounter  Medications  . chlorthalidone (HYGROTON) 25 MG tablet    Sig: Take 1 tablet (25 mg total)  by mouth daily.    Dispense:  90 tablet    Refill:  0    Order Specific Question:   Supervising Provider    Answer:   Wilson Singer [[id number]]   This encounter lasted for 12 minutes and 13 seconds. Patient to follow-up in 3 weeks.  Elenore Paddy, NP

## 2019-04-03 ENCOUNTER — Ambulatory Visit (INDEPENDENT_AMBULATORY_CARE_PROVIDER_SITE_OTHER): Payer: BLUE CROSS/BLUE SHIELD | Admitting: Internal Medicine

## 2019-04-09 ENCOUNTER — Encounter (INDEPENDENT_AMBULATORY_CARE_PROVIDER_SITE_OTHER): Payer: Self-pay | Admitting: Nurse Practitioner

## 2019-04-09 ENCOUNTER — Other Ambulatory Visit: Payer: Self-pay

## 2019-04-09 ENCOUNTER — Ambulatory Visit (INDEPENDENT_AMBULATORY_CARE_PROVIDER_SITE_OTHER): Payer: Managed Care, Other (non HMO) | Admitting: Nurse Practitioner

## 2019-04-09 VITALS — BP 142/92 | HR 82 | Temp 98.2°F | Resp 14 | Ht 71.0 in | Wt 192.6 lb

## 2019-04-09 DIAGNOSIS — Z0001 Encounter for general adult medical examination with abnormal findings: Secondary | ICD-10-CM | POA: Diagnosis not present

## 2019-04-09 DIAGNOSIS — Z125 Encounter for screening for malignant neoplasm of prostate: Secondary | ICD-10-CM

## 2019-04-09 DIAGNOSIS — Z1329 Encounter for screening for other suspected endocrine disorder: Secondary | ICD-10-CM

## 2019-04-09 DIAGNOSIS — I10 Essential (primary) hypertension: Secondary | ICD-10-CM | POA: Diagnosis not present

## 2019-04-09 DIAGNOSIS — Z139 Encounter for screening, unspecified: Secondary | ICD-10-CM

## 2019-04-09 DIAGNOSIS — Z13220 Encounter for screening for lipoid disorders: Secondary | ICD-10-CM

## 2019-04-09 DIAGNOSIS — R7303 Prediabetes: Secondary | ICD-10-CM

## 2019-04-09 DIAGNOSIS — E559 Vitamin D deficiency, unspecified: Secondary | ICD-10-CM

## 2019-04-09 MED ORDER — CHLORTHALIDONE 50 MG PO TABS: 50.0000 mg | ORAL_TABLET | Freq: Every day | ORAL | 0 refills | Status: DC

## 2019-04-09 NOTE — Assessment & Plan Note (Signed)
He declined to have any immunizations administered today.  He seems to be up-to-date with most screenings other than prostate cancer screening.  I will add this to his blood work today.  In addition to screening for his prostate we will collect other blood work for further evaluation.

## 2019-04-09 NOTE — Patient Instructions (Signed)
Thank you for choosing [facility] as your medical provider! If you have any questions or concerns regarding your health care, please do not hesitate to call our office.  Increase your dose of chlorthalidone from 25 mg to 50 mg daily.  You can take 2 pills by mouth every morning of your current bottle of medication.  Once this runs out and you fill your new bottle, you can go back to 1 pill by mouth every day because the new refill will be a 50 mg tablet.  If you feel unwell in any way go back to taking 1 pill by mouth daily and let me know.  Also please monitor your blood pressure at home periodically and keep a log of this.  I will send your blood work results to you in the mail or I will call you depending on if we need to discuss anything further.  Please consider getting immunized against the flu as well as shingles.  If you have any questions please do not hesitate to call this office.  Please follow-up as scheduled in 1 month. We look forward to seeing you again soon!   At [facility] we value your feedback. You may receive a survey about your visit today. Please share your experience as we strive to create trusting relationships with our patients to provide genuine, compassionate, quality care.  We appreciate your understanding and patience as we review any laboratory studies, imaging, and other diagnostic tests that are ordered as we care for you. We do our best to address any and all results in a timely manner. If you do not hear about test results within 1 week, please do not hesitate to contact us. If we referred you to a specialist during your visit or ordered imaging testing, contact the office if you have not been contacted to be scheduled within 1 weeks.  We also encourage the use of MyChart, which contains your medical information for your review as well. If you are not enrolled in this feature, an access code is on this after visit summary for your convenience. Thank  you for allowing [place] to be involved in your care.

## 2019-04-09 NOTE — Assessment & Plan Note (Signed)
He is not currently on any supplementation at this time however he does have a history of vitamin D deficiency.  Thus we will check vitamin D levels today for further evaluation.

## 2019-04-09 NOTE — Assessment & Plan Note (Signed)
I will collect hemoglobin A1c for further evaluation.

## 2019-04-09 NOTE — Progress Notes (Signed)
Subjective:  Patient ID: Alejandro Campbell, male    DOB: 1961-03-29  Age: 59 y.o. MRN: [id number]  CC:  Chief Complaint  Patient presents with  . Annual Exam      HPI  This patient arrives today for his annual physical exam as well as to address his hypertension.  He will be due for flu and shingles vaccines.  He believes he had a tetanus shot approximately 4 years ago.  He would like to hold off on having any of these immunizations administered at this time.  He believes he had a colonoscopy on around 2017, and tells me that he was told to rescreen in approximately 10 years.  He does not want to have sex transmitted infection screen today he also does not want to have hepatitis C screen today.  He is due for depression screen.  He does have a history of prediabetes and will need to be screened for diabetes again today.  He quit smoking about 21 years ago also does not qualify for lung cancer screening.  He has had prostate cancer screening completed back in December 2019 and at that point his PSA was 0.7.  He would like to be screened again today for this.  He does have a history of hypertension and was on hydrochlorothiazide for this.  We did do a telemedicine video visit last month at which time I started him on chlorthalidone.  He tells me has been tolerating this medication well.  He only checks his blood pressure periodically over at his in-laws house.  He has not noticed much improvement in his blood pressure at home.  Past Medical History:  Diagnosis Date  . Hypertension   . Vitamin D deficiency       Family History  Problem Relation Age of Onset  . Hypertension Mother   . Hyperlipidemia Mother   . CVA Mother   . Renal Disease Father        ESRD  . Hypertension Brother     Social History   Social History Narrative  . Not on file   Social History   Tobacco Use  . Smoking status: Former Smoker    Quit date: 04/05/1998    Years since quitting: 21.0  . Smokeless  tobacco: Never Used  Substance Use Topics  . Alcohol use: Not on file    Comment: occasional     No outpatient medications have been marked as taking for the 04/09/19 encounter (Office Visit) with Ailene Ards, NP.    ROS:  Review of Systems  Constitutional: Negative.   Respiratory: Negative.   Cardiovascular: Negative.   Gastrointestinal: Negative.   Neurological: Negative.      Objective:   Today's Vitals: BP 138/90   Pulse 82   Temp 98.2 F (36.8 C)   Resp 14   Ht 5\' 11"  (1.803 m)   Wt 192 lb 9.6 oz (87.4 kg)   SpO2 96%   BMI 26.86 kg/m  Vitals with BMI 04/09/2019 03/16/2019 12/18/2013  Height 5\' 11"  - 5\' 11"   Weight 192 lbs 10 oz - 190 lbs  BMI 34.91 - 79.1  Systolic 505 (No Data) 697  Diastolic 90 (No Data) 99  Pulse 82 - 86     Physical Exam Vitals reviewed.  Constitutional:      General: He is not in acute distress.    Appearance: Normal appearance. He is not ill-appearing.  HENT:     Head: Normocephalic and atraumatic.  Right Ear: Tympanic membrane, ear canal and external ear normal.     Left Ear: Tympanic membrane, ear canal and external ear normal.  Eyes:     General: No scleral icterus.    Extraocular Movements: Extraocular movements intact.     Conjunctiva/sclera: Conjunctivae normal.     Pupils: Pupils are equal, round, and reactive to light.  Neck:     Vascular: No carotid bruit.  Cardiovascular:     Rate and Rhythm: Normal rate and regular rhythm.     Pulses: Normal pulses.     Heart sounds: Normal heart sounds.  Pulmonary:     Effort: Pulmonary effort is normal.     Breath sounds: Normal breath sounds.  Abdominal:     General: Bowel sounds are normal. There is no distension.     Palpations: There is no mass.     Tenderness: There is no abdominal tenderness.     Hernia: No hernia is present.  Musculoskeletal:        General: No swelling or tenderness.     Cervical back: Normal range of motion and neck supple. No rigidity.    Lymphadenopathy:     Cervical: No cervical adenopathy.  Skin:    General: Skin is warm and dry.  Neurological:     General: No focal deficit present.     Mental Status: He is alert and oriented to person, place, and time.     Cranial Nerves: No cranial nerve deficit.     Sensory: No sensory deficit.     Motor: No weakness.     Gait: Gait normal.  Psychiatric:        Mood and Affect: Mood normal.        Behavior: Behavior normal.        Judgment: Judgment normal.      PHQ2: Negative for depression today.   Assessment   No diagnosis found.    Tests ordered No orders of the defined types were placed in this encounter.    Plan: Please see assessment and plan per problem list below.   No orders of the defined types were placed in this encounter.   Patient to follow-up in 1 month for blood pressure check as well as to recheck his metabolic panel since increasing his chlorthalidone.  Total time spent on this encounter today was 48 minutes.  Elenore Paddy, NP

## 2019-04-09 NOTE — Assessment & Plan Note (Signed)
Blood pressure still above goal today in the office.  We did discuss different options including increasing his chlorthalidone versus adding a different medication.  He is hesitant to do either option, but is more willing to try increasing his chlorthalidone.  I told him to take 50 mg daily as opposed to 25 mg daily I did caution him regarding potential side effects including dizziness, shortness of breath, chest pain, blurry vision, and Stevens-Johnson syndrome rash.  I told him if any of these occur that he should call the office and especially if the Stevens-Johnson syndrome rash occurs he should stop take the medication right away and let us know or proceed to the emergency department.  He tells me he understands.

## 2019-04-10 ENCOUNTER — Encounter (INDEPENDENT_AMBULATORY_CARE_PROVIDER_SITE_OTHER): Payer: Self-pay | Admitting: Nurse Practitioner

## 2019-04-10 ENCOUNTER — Telehealth (INDEPENDENT_AMBULATORY_CARE_PROVIDER_SITE_OTHER): Payer: Self-pay

## 2019-04-10 LAB — CBC
HCT: 49.2 % (ref 38.5–50.0)
Hemoglobin: 16.9 g/dL (ref 13.2–17.1)
MCH: 30.9 pg (ref 27.0–33.0)
MCHC: 34.3 g/dL (ref 32.0–36.0)
MCV: 89.9 fL (ref 80.0–100.0)
MPV: 10.3 fL (ref 7.5–12.5)
Platelets: 358 10*3/uL (ref 140–400)
RBC: 5.47 10*6/uL (ref 4.20–5.80)
RDW: 12.6 % (ref 11.0–15.0)
WBC: 7.3 10*3/uL (ref 3.8–10.8)

## 2019-04-10 LAB — COMPLETE METABOLIC PANEL WITH GFR
AG Ratio: 1.7 (calc) (ref 1.0–2.5)
ALT: 58 U/L — ABNORMAL HIGH (ref 9–46)
AST: 44 U/L — ABNORMAL HIGH (ref 10–35)
Albumin: 4.3 g/dL (ref 3.6–5.1)
Alkaline phosphatase (APISO): 54 U/L (ref 35–144)
BUN: 13 mg/dL (ref 7–25)
CO2: 30 mmol/L (ref 20–32)
Calcium: 9.2 mg/dL (ref 8.6–10.3)
Chloride: 99 mmol/L (ref 98–110)
Creat: 1.02 mg/dL (ref 0.70–1.33)
GFR, Est African American: 93 mL/min/{1.73_m2} (ref >=60)
GFR, Est Non African American: 81 mL/min/{1.73_m2} (ref >=60)
Globulin: 2.6 g/dL (calc) (ref 1.9–3.7)
Glucose, Bld: 96 mg/dL (ref 65–99)
Potassium: 3.7 mmol/L (ref 3.5–5.3)
Sodium: 138 mmol/L (ref 135–146)
Total Bilirubin: 0.6 mg/dL (ref 0.2–1.2)
Total Protein: 6.9 g/dL (ref 6.1–8.1)

## 2019-04-10 LAB — LIPID PANEL
Cholesterol: 188 mg/dL (ref <200)
HDL: 42 mg/dL (ref >=40)
LDL Cholesterol (Calc): 115 mg/dL (calc) — ABNORMAL HIGH
Non-HDL Cholesterol (Calc): 146 mg/dL (calc) — ABNORMAL HIGH (ref <130)
Total CHOL/HDL Ratio: 4.5 (calc) (ref <5.0)
Triglycerides: 190 mg/dL — ABNORMAL HIGH (ref <150)

## 2019-04-10 LAB — VITAMIN D 25 HYDROXY (VIT D DEFICIENCY, FRACTURES): Vit D, 25-Hydroxy: 16 ng/mL — ABNORMAL LOW (ref 30–100)

## 2019-04-10 LAB — TSH: TSH: 1.17 mIU/L (ref 0.40–4.50)

## 2019-04-10 LAB — PSA: PSA: 0.6 ng/mL (ref <=4.0)

## 2019-04-10 LAB — HEMOGLOBIN A1C
Hgb A1c MFr Bld: 5.8 % of total Hgb — ABNORMAL HIGH (ref <5.7)
Mean Plasma Glucose: 120 (calc)
eAG (mmol/L): 6.6 (calc)

## 2019-04-10 NOTE — Telephone Encounter (Signed)
I was able to call the pharmacy and answer their questions.  His dose was increased to 50 mg daily.  They are now aware.

## 2019-04-10 NOTE — Telephone Encounter (Signed)
Pharmacy called and said the new rx if for 50 mg and the past rx was 25mg .  Do you want the profile changed to 50 mg?  Please call the pharmacy at [phone]

## 2019-05-07 ENCOUNTER — Ambulatory Visit (INDEPENDENT_AMBULATORY_CARE_PROVIDER_SITE_OTHER): Payer: Managed Care, Other (non HMO) | Admitting: Nurse Practitioner

## 2019-10-09 ENCOUNTER — Other Ambulatory Visit (INDEPENDENT_AMBULATORY_CARE_PROVIDER_SITE_OTHER): Payer: Self-pay | Admitting: Nurse Practitioner

## 2019-10-09 DIAGNOSIS — I10 Essential (primary) hypertension: Secondary | ICD-10-CM

## 2019-10-22 ENCOUNTER — Other Ambulatory Visit: Payer: Self-pay

## 2019-10-22 ENCOUNTER — Ambulatory Visit (INDEPENDENT_AMBULATORY_CARE_PROVIDER_SITE_OTHER): Payer: Managed Care, Other (non HMO) | Admitting: Nurse Practitioner

## 2019-10-22 ENCOUNTER — Encounter (INDEPENDENT_AMBULATORY_CARE_PROVIDER_SITE_OTHER): Payer: Self-pay | Admitting: Nurse Practitioner

## 2019-10-22 VITALS — BP 140/100 | Temp 97.1°F | Ht 71.0 in | Wt 195.8 lb

## 2019-10-22 DIAGNOSIS — R202 Paresthesia of skin: Secondary | ICD-10-CM | POA: Diagnosis not present

## 2019-10-22 DIAGNOSIS — R200 Anesthesia of skin: Secondary | ICD-10-CM | POA: Diagnosis not present

## 2019-10-22 DIAGNOSIS — I10 Essential (primary) hypertension: Secondary | ICD-10-CM | POA: Diagnosis not present

## 2019-10-22 MED ORDER — AMLODIPINE BESYLATE 2.5 MG PO TABS: 2.5000 mg | ORAL_TABLET | Freq: Every day | ORAL | 3 refills | Status: DC

## 2019-10-22 NOTE — Progress Notes (Signed)
Subjective:  Patient ID: Alejandro Campbell, male    DOB: [birth year]  Age: 59 y.o. MRN: [id number]  CC:  Chief Complaint  Patient presents with  . Hypertension  . Follow-up    [place] in fingers and toes      HPI  This patient arrives today for the above.  Hypertension: He continues on chlorthalidone 50 mg daily.  Per chart review I see that this was started back in January and he is supposed to follow-up for repeat blood work shortly after, but it appears he never completed follow-up.  He has been tolerating medication well and tells me that recently he has been eating quite a bit of salted sunflower seeds consulted tomatoes, and he started to experience some dizziness and felt that it was due to the increase salt intake.  Apparently he has a history of experiencing vertigo when he has quite a bit of salt intake.  Thus, he reduced his intake of salted sunflowers seeds and salted tomatoes, since reducing his intake of salt the dizziness has subsided.   He also mentions that he has been experiencing some tingling and numbness to his fingers and toes.  He tells me that these feelings are present constantly, however intensity waxes and wanes.  He does not experience pain with this sensation.  He tells me the only thing he has noticed that makes the numbness worse is when he is driving or when he holds his hands up.  He denies any intermittent claudication to his lower extremities.  He tells me that he will sometimes massage his fingertips to get the feeling to come back and this seems to help with his numbness mildly.  He would like to see a vascular specialist because he believes this is related to reduced blood flow.  He does have a history of prediabetes, he is not on medication for this.  He does not have any history of known anemia.  He denies any other neurological symptoms such as visual changes or weakness.   Past Medical History:  Diagnosis Date  . Hypertension   . Vitamin D  deficiency       Family History  Problem Relation Age of Onset  . Hypertension Mother   . Hyperlipidemia Mother   . CVA Mother   . Renal Disease Father        ESRD  . Hypertension Brother     Social History   Social History Narrative  . Not on file   Social History   Tobacco Use  . Smoking status: Former Smoker    Quit date: 04/05/1998    Years since quitting: 21.5  . Smokeless tobacco: Never Used  Substance Use Topics  . Alcohol use: Not on file    Comment: occasional     Current Meds  Medication Sig  . chlorthalidone (HYGROTON) 50 MG tablet TAKE 1 TABLET BY MOUTH DAILY    ROS:  Review of Systems  Constitutional: Negative for fever and malaise/fatigue.  Eyes: Negative for blurred vision.  Respiratory: Negative for cough and shortness of breath.   Cardiovascular: Negative for chest pain and palpitations.  Neurological: Positive for dizziness, tingling and headaches.     Objective:   Today's Vitals: BP (!) 140/100 (BP Location: Left Arm, Patient Position: Sitting, Cuff Size: Normal)   Temp (!) 97.1 F (36.2 C) (Temporal)   Ht _0  (1.803 m)   Wt 195 lb 12.8 oz (88.8 kg)   BMI 27.31 kg/m  Vitals  with BMI 10/22/2019 04/09/2019 04/09/2019  Height _0  - _1   Weight 195 lbs 13 oz - 192 lbs 10 oz  BMI 44.03 - 47.42  Systolic 595 638 756  Diastolic 433 92 90  Pulse - - 82     Physical Exam Vitals reviewed.  Constitutional:      Appearance: Normal appearance.  HENT:     Head: Normocephalic and atraumatic.  Cardiovascular:     Rate and Rhythm: Normal rate and regular rhythm.     Pulses:          Radial pulses are 2+ on the right side and 2+ on the left side.       Posterior tibial pulses are 2+ on the right side and 2+ on the left side.  Pulmonary:     Effort: Pulmonary effort is normal.     Breath sounds: Normal breath sounds.  Musculoskeletal:     Cervical back: Neck supple.  Skin:    General: Skin is warm and dry.  Neurological:      Mental Status: He is alert and oriented to person, place, and time.     Comments: (-) Phalen's test  Psychiatric:        Mood and Affect: Mood normal.        Behavior: Behavior normal.        Thought Content: Thought content normal.        Judgment: Judgment normal.          Assessment and Plan   1. Numbness and tingling   2. Hypertension, unspecified type      Plan: 1.  Generally neurologic evaluation is intact and normal.  I did discuss referral to vascular surgery versus neurology for further evaluation of his paresthesias.  He would prefer to first be evaluated by vascular surgeon because he really is concerned about his circulation.  I will send referral for vascular surgery for further evaluation of his numbness and tingling.  In addition to referral I will collect some basic blood work today including metabolic panel, vitamin I95 level, and complete blood count for further evaluation.  2.  His blood pressure remains above goal.  I think we should add a second agent at this point.  We will initiate low-dose of amlodipine.  He will follow-up in 2 weeks for virtual visit as he does have an at home blood pressure cuff that is been purchased within the last 6 months he also has access to a calibrated cuff at his place of employment.  I will also collect CMP to monitor kidney function and electrolyte levels.   Tests ordered Orders Placed This Encounter  Procedures  . CMP with eGFR([facility])  . Vitamin B12  . CBC  . Ambulatory referral to Vascular Surgery      Meds ordered this encounter  Medications  . amLODipine (NORVASC) 2.5 MG tablet    Sig: Take 1 tablet (2.5 mg total) by mouth daily.    Dispense:  90 tablet    Refill:  3    Order Specific Question:   Supervising Provider    Answer:   Doree Albee [[id number]]    Patient to follow-up in 2 weeks, or sooner as needed.  Ailene Ards, NP

## 2019-10-23 LAB — CBC
HCT: 50.6 % — ABNORMAL HIGH (ref 38.5–50.0)
Hemoglobin: 17.2 g/dL — ABNORMAL HIGH (ref 13.2–17.1)
MCH: 30.7 pg (ref 27.0–33.0)
MCHC: 34 g/dL (ref 32.0–36.0)
MCV: 90.4 fL (ref 80.0–100.0)
MPV: 10.2 fL (ref 7.5–12.5)
Platelets: 339 10*3/uL (ref 140–400)
RBC: 5.6 10*6/uL (ref 4.20–5.80)
RDW: 12.7 % (ref 11.0–15.0)
WBC: 8.4 10*3/uL (ref 3.8–10.8)

## 2019-10-23 LAB — COMPLETE METABOLIC PANEL WITH GFR
AG Ratio: 1.7 (calc) (ref 1.0–2.5)
ALT: 50 U/L — ABNORMAL HIGH (ref 9–46)
AST: 32 U/L (ref 10–35)
Albumin: 4.4 g/dL (ref 3.6–5.1)
Alkaline phosphatase (APISO): 50 U/L (ref 35–144)
BUN: 16 mg/dL (ref 7–25)
CO2: 30 mmol/L (ref 20–32)
Calcium: 9.9 mg/dL (ref 8.6–10.3)
Chloride: 98 mmol/L (ref 98–110)
Creat: 1.06 mg/dL (ref 0.70–1.33)
GFR, Est African American: 89 mL/min/{1.73_m2} (ref >=60)
GFR, Est Non African American: 77 mL/min/{1.73_m2} (ref >=60)
Globulin: 2.6 g/dL (calc) (ref 1.9–3.7)
Glucose, Bld: 112 mg/dL — ABNORMAL HIGH (ref 65–99)
Potassium: 4 mmol/L (ref 3.5–5.3)
Sodium: 138 mmol/L (ref 135–146)
Total Bilirubin: 0.6 mg/dL (ref 0.2–1.2)
Total Protein: 7 g/dL (ref 6.1–8.1)

## 2019-10-23 LAB — VITAMIN B12: Vitamin B-12: 418 pg/mL (ref 200–1100)

## 2019-11-05 ENCOUNTER — Other Ambulatory Visit: Payer: Self-pay

## 2019-11-05 ENCOUNTER — Encounter (INDEPENDENT_AMBULATORY_CARE_PROVIDER_SITE_OTHER): Payer: Self-pay | Admitting: Nurse Practitioner

## 2019-11-05 ENCOUNTER — Telehealth (INDEPENDENT_AMBULATORY_CARE_PROVIDER_SITE_OTHER): Payer: Managed Care, Other (non HMO) | Admitting: Nurse Practitioner

## 2019-11-05 ENCOUNTER — Telehealth (INDEPENDENT_AMBULATORY_CARE_PROVIDER_SITE_OTHER): Payer: Self-pay | Admitting: Nurse Practitioner

## 2019-11-05 VITALS — BP 145/96 | Ht 71.0 in | Wt 196.0 lb

## 2019-11-05 DIAGNOSIS — R748 Abnormal levels of other serum enzymes: Secondary | ICD-10-CM | POA: Diagnosis not present

## 2019-11-05 DIAGNOSIS — I10 Essential (primary) hypertension: Secondary | ICD-10-CM | POA: Diagnosis not present

## 2019-11-05 DIAGNOSIS — R200 Anesthesia of skin: Secondary | ICD-10-CM | POA: Diagnosis not present

## 2019-11-05 DIAGNOSIS — D582 Other hemoglobinopathies: Secondary | ICD-10-CM

## 2019-11-05 DIAGNOSIS — R202 Paresthesia of skin: Secondary | ICD-10-CM

## 2019-11-05 NOTE — Progress Notes (Signed)
Due to national recommendations of social distancing related to the COVID19 pandemic, an audio/visual tele-health visit was felt to be the most appropriate encounter type for this patient today. I connected with  Alejandro Campbell on 11/05/19 utilizing audio/visual technology and verified that I am speaking with the correct person using two identifiers. The patient was located at their place of employment, and I was located at the office of [facility] during the encounter. I discussed the limitations of evaluation and management by telemedicine. The patient expressed understanding and agreed to proceed.      Subjective:  Patient ID: Alejandro Campbell, male    DOB: [birth year]  Age: 59 y.o. MRN: [id number]  CC:  Chief Complaint  Patient presents with  . Hypertension  . Numbness      HPI  This patient arrives for virtual visit today for the above.  He has a history of hypertension and at his last office visit we added amlodipine to his drug regimen.  He also continues on his chlorthalidone 50 mg and is tolerating this well.  He tells me at home his blood pressure has been running in the 120s over 80s, today at work when he checked it this morning it was slightly elevated at 145/96.  He had also complained of some numbness and tingling to his fingertips and toes at his last office visit.  He was referred to vascular surgeon per his request because he is concerned about circulation problems at last office visit.  He tells me he has not yet heard from their office to get this appointment scheduled.  Of note, labs were collected at last office visit for further evaluation of his numbness and I did note that his ALT was elevated at a level of 50.  It was at 58 back in January of this year.  His AST was normal on blood work a couple weeks ago, the back in January it was elevated that time as well.  He tells me that he does drink alcohol but it is very rare.  He denies any abdominal pain, change  in stool color or consistency, or skin color changes.  Alkaline phosphatase level was normal.  He also had a mildly elevated hemoglobin at his last blood work and he does denies smoking today.  In January CBC showed normal hemoglobin.   Past Medical History:  Diagnosis Date  . Hypertension   . Vitamin D deficiency       Family History  Problem Relation Age of Onset  . Hypertension Mother   . Hyperlipidemia Mother   . CVA Mother   . Renal Disease Father        ESRD  . Hypertension Brother     Social History   Social History Narrative  . Not on file   Social History   Tobacco Use  . Smoking status: Former Smoker    Quit date: 04/05/1998    Years since quitting: 21.6  . Smokeless tobacco: Never Used  Substance Use Topics  . Alcohol use: Not on file    Comment: occasional     Current Meds  Medication Sig  . amLODipine (NORVASC) 2.5 MG tablet Take 1 tablet (2.5 mg total) by mouth daily.  . chlorthalidone (HYGROTON) 50 MG tablet TAKE 1 TABLET BY MOUTH DAILY    ROS:  Review of Systems  Constitutional: Negative for malaise/fatigue.  Eyes: Positive for blurred vision.  Respiratory: Negative for shortness of breath.   Cardiovascular: Negative for  chest pain and palpitations.  Gastrointestinal: Negative for abdominal pain.  Neurological: Negative for dizziness and headaches.     Objective:   Today's Vitals: BP (!) 145/96   Ht 5\' 11"  (1.803 m)   Wt 196 lb (88.9 kg)   BMI 27.34 kg/m  Vitals with BMI 11/05/2019 10/22/2019 04/09/2019  Height 5\' 11"  5\' 11"  -  Weight 196 lbs 195 lbs 13 oz -  BMI 27.35 27.32 -  Systolic 145 140 06/07/2019  Diastolic 96 100 92  Pulse - - -     Physical Exam Comprehensive physical exam not completed today as office visit was done remotely.  Patient was alert and oriented, she answered questions appropriately, and appeared to have appropriate judgment.  He was groomed appropriately and did not appear to be in any acute  distress.      Assessment and Plan   1. Essential hypertension   2. Numbness and tingling   3. Elevated liver enzymes   4. Elevated hemoglobin (HCC)      Plan: 1.  Resting blood pressures appear to be close to goal.  We will continue on current medication regimen as currently prescribed for now.  2.  We will request staff to look into status of referral to vascular surgeon.  3-4.  I did discuss these findings with the patient today and recommended we consider abdominal ultrasound to evaluate liver and rechecking CBC.  He would like to have this checked at next office visit.  He will follow-up in 3 months at which point we should repeat blood work and consider ultrasound of the liver if necessary.   Tests ordered No orders of the defined types were placed in this encounter.     No orders of the defined types were placed in this encounter.   Patient to follow-up in 3 months or sooner as needed.  , NP

## 2019-11-05 NOTE — Telephone Encounter (Signed)
Benedetto Goad, will you check into the referral that was made in July to vascular surgeon.  I believe he was referred because he is concerned about circulation and symptoms of numbness and tingling in his fingertips and toes.  I see that the referral was ordered, but the patient tells me he has not heard from anybody regarding getting this appointment set up.  Please look into this and let me know what you find out.  Thank you.

## 2019-11-06 NOTE — Telephone Encounter (Signed)
Referral has been made not sure why it hasn't been scheduled yet, will continue to check

## 2019-12-14 ENCOUNTER — Other Ambulatory Visit (INDEPENDENT_AMBULATORY_CARE_PROVIDER_SITE_OTHER): Payer: Self-pay | Admitting: Nurse Practitioner

## 2019-12-14 DIAGNOSIS — I10 Essential (primary) hypertension: Secondary | ICD-10-CM

## 2019-12-21 ENCOUNTER — Other Ambulatory Visit: Payer: Self-pay

## 2019-12-21 DIAGNOSIS — R200 Anesthesia of skin: Secondary | ICD-10-CM

## 2020-01-04 ENCOUNTER — Ambulatory Visit (INDEPENDENT_AMBULATORY_CARE_PROVIDER_SITE_OTHER): Payer: BC Managed Care – PPO | Admitting: Vascular Surgery

## 2020-01-04 ENCOUNTER — Encounter: Payer: Self-pay | Admitting: Vascular Surgery

## 2020-01-04 ENCOUNTER — Other Ambulatory Visit: Payer: Self-pay

## 2020-01-04 ENCOUNTER — Ambulatory Visit (HOSPITAL_COMMUNITY)
Admission: RE | Admit: 2020-01-04 | Discharge: 2020-01-04 | Disposition: A | Discharge status: Home / Self Care | Payer: BC Managed Care – PPO | Source: Ambulatory Visit | Attending: Vascular Surgery | Admitting: Vascular Surgery

## 2020-01-04 VITALS — BP 146/110 | Temp 98.1°F | Resp 20 | Ht 71.0 in | Wt 194.0 lb

## 2020-01-04 DIAGNOSIS — R200 Anesthesia of skin: Secondary | ICD-10-CM

## 2020-01-04 NOTE — Progress Notes (Signed)
Patient ID: Alejandro Campbell, male   DOB: 1960/12/29, 59 y.o.   MRN: [id number]  Reason for Consult: New Patient (Initial Visit)   Referred by Wilson Singer, MD  Subjective:     HPI:  Alejandro Campbell is a 59 y.o. male sent for evaluation of bilateral toe and finger numbness.  States that it occurs quite frequently throughout the day also when he is laying down.  He is unsure exactly the time.Marland Kitchen  Has never had tissue loss or ulceration.  Risk factors for vascular disease include hypertension as well as previous smoking history.  Did not have any stroke TIA or amaurosis.  No tissue loss or ulceration.  No personal or family history of aneurysm.  Past Medical History:  Diagnosis Date  . Hypertension   . Vitamin D deficiency    Family History  Problem Relation Age of Onset  . Hypertension Mother   . Hyperlipidemia Mother   . CVA Mother   . Renal Disease Father        ESRD  . Hypertension Brother    Past Surgical History:  Procedure Laterality Date  . HIP ARTHROPLASTY    . KNEE ARTHROPLASTY Right     Short Social History:  Social History   Tobacco Use  . Smoking status: Former Smoker    Quit date: 04/05/1998    Years since quitting: 21.7  . Smokeless tobacco: Never Used  Substance Use Topics  . Alcohol use: Not on file    Comment: occasional    Allergies  Allergen Reactions  . Other     pollen    Current Outpatient Medications  Medication Sig Dispense Refill  . amLODipine (NORVASC) 2.5 MG tablet Take 1 tablet (2.5 mg total) by mouth daily. 90 tablet 3  . chlorthalidone (HYGROTON) 50 MG tablet TAKE ONE TABLET BY MOUTH DAILY 30 tablet 3   No current facility-administered medications for this visit.    Review of Systems  Constitutional:  Constitutional negative. HENT: HENT negative.  Eyes: Eyes negative.  Respiratory: Respiratory negative.  Cardiovascular: Cardiovascular negative.  GI: Gastrointestinal negative.  Musculoskeletal: Musculoskeletal negative.    Neurological: Positive for numbness.  Hematologic: Hematologic/lymphatic negative.  Psychiatric: Psychiatric negative.        Objective:   Physical Exam HENT:     Head: Normocephalic.     Nose:     Comments: Wearing a mask Eyes:     Extraocular Movements: Extraocular movements intact.     Pupils: Pupils are equal, round, and reactive to light.  Neck:     Vascular: No carotid bruit.  Cardiovascular:     Rate and Rhythm: Normal rate.     Pulses: Normal pulses.  Pulmonary:     Effort: Pulmonary effort is normal.     Breath sounds: Normal breath sounds.  Abdominal:     General: Abdomen is flat.     Palpations: Abdomen is soft. There is no mass.  Skin:    General: Skin is warm and dry.     Capillary Refill: Capillary refill takes less than 2 seconds.  Neurological:     General: No focal deficit present.     Mental Status: He is alert.  Psychiatric:        Mood and Affect: Mood normal.        Thought Content: Thought content normal.        Judgment: Judgment normal.    Vitals:   01/04/20 1344  BP: (!) 146/110  Resp: 20  Temp: 98.1 F (36.7 C)  SpO2: 96%      Data: I reviewed and interpreted his ABIs to be greater than 1 bilaterally with toe pressure on the right 98 and left 137.  All waveforms are triphasic.     Assessment/Plan:     59 year old male with bilateral toe and finger numbness.  ABIs are normal all pulses are palpable.  Likely related to underlying neurologic issue possibly medication related or primary neuropathy.  Either way his arteries appear healthy does not need vascular invention for follow-up with me on an as-needed basis.     Maeola Harman MD [facility]

## 2020-01-14 ENCOUNTER — Ambulatory Visit (INDEPENDENT_AMBULATORY_CARE_PROVIDER_SITE_OTHER): Payer: BC Managed Care – PPO | Admitting: Nurse Practitioner

## 2020-01-14 ENCOUNTER — Encounter (INDEPENDENT_AMBULATORY_CARE_PROVIDER_SITE_OTHER): Payer: Self-pay | Admitting: Nurse Practitioner

## 2020-01-14 ENCOUNTER — Other Ambulatory Visit: Payer: Self-pay

## 2020-01-14 VITALS — BP 147/77 | HR 77 | Temp 97.9°F | Resp 18 | Ht 71.0 in | Wt 194.0 lb

## 2020-01-14 DIAGNOSIS — E785 Hyperlipidemia, unspecified: Secondary | ICD-10-CM | POA: Diagnosis not present

## 2020-01-14 DIAGNOSIS — Z1329 Encounter for screening for other suspected endocrine disorder: Secondary | ICD-10-CM

## 2020-01-14 DIAGNOSIS — I10 Essential (primary) hypertension: Secondary | ICD-10-CM

## 2020-01-14 DIAGNOSIS — R059 Cough, unspecified: Secondary | ICD-10-CM

## 2020-01-14 DIAGNOSIS — R0683 Snoring: Secondary | ICD-10-CM

## 2020-01-14 DIAGNOSIS — Z0001 Encounter for general adult medical examination with abnormal findings: Secondary | ICD-10-CM | POA: Diagnosis not present

## 2020-01-14 DIAGNOSIS — E559 Vitamin D deficiency, unspecified: Secondary | ICD-10-CM

## 2020-01-14 MED ORDER — CHLORTHALIDONE 50 MG PO TABS: 50.0000 mg | ORAL_TABLET | Freq: Every day | ORAL | 1 refills | Status: DC

## 2020-01-14 MED ORDER — AMLODIPINE BESYLATE 5 MG PO TABS: 5.0000 mg | ORAL_TABLET | Freq: Every day | ORAL | 1 refills | Status: DC

## 2020-01-14 NOTE — Progress Notes (Signed)
Subjective:  Patient ID: Alejandro Campbell, male    DOB: 07/12/1960  Age: 59 y.o. MRN: [id number]  CC:  Chief Complaint  Patient presents with  . Annual Exam      HPI  This patient arrives today for the above.  This patient arrives today for his annual exam.  He tells me he is generally feeling well but he has been experiencing some upper respiratory symptoms since last Sunday.  He tells me he was having some sinus congestion, nasal congestion, postnasal drip, and dry cough.  He tells me he did take a rapid COVID-19 test this came back negative he is also fully vaccinated has not been exposed to any sick contacts as far as I know.  He denies any fever.  He denies chest pain as well as shortness of breath. Overall his symptoms are improving, but he wanted to mention it in case I wanted to perform additional diagnostics.  Otherwise he is feeling well.  He does have a history of hypertension and continues on chlorthalidone 50 mg daily and amlodipine 2.5 mg daily.  Of note he also has had a history of elevated liver enzymes and was told to undergo abdominal ultrasound he decided he needed to hold off on this and has not undergone ultrasound yet.  He does have a history of mild hyperlipidemia and is not currently on any statin therapy.  He does have history of vitamin D deficiency is not on a supplement at this time.  He also mention to me today that he has been snoring, and when asked tells me that he has been told he stops breathing in his sleep by his wife.  He has not been evaluated for sleep apnea.  He is due for blood work today.  He would prefer to hold off on flu shot for right now.  Past Medical History:  Diagnosis Date  . Hypertension   . Vitamin D deficiency       Family History  Problem Relation Age of Onset  . Hypertension Mother   . Hyperlipidemia Mother   . CVA Mother   . Renal Disease Father        ESRD  . Hypertension Brother     Social History   Social History  Narrative  . Not on file   Social History   Tobacco Use  . Smoking status: Former Smoker    Quit date: 04/05/1998    Years since quitting: 21.7  . Smokeless tobacco: Never Used  Substance Use Topics  . Alcohol use: Not on file    Comment: occasional     Current Meds  Medication Sig  . amLODipine (NORVASC) 5 MG tablet Take 1 tablet (5 mg total) by mouth daily.  . chlorthalidone (HYGROTON) 50 MG tablet Take 1 tablet (50 mg total) by mouth daily.  . [DISCONTINUED] amLODipine (NORVASC) 2.5 MG tablet Take 1 tablet (2.5 mg total) by mouth daily.  . [DISCONTINUED] chlorthalidone (HYGROTON) 50 MG tablet TAKE ONE TABLET BY MOUTH DAILY    ROS:  Review of Systems  Constitutional: Negative for fever and malaise/fatigue.  HENT: Positive for congestion, sinus pain and sore throat.   Respiratory: Positive for cough. Negative for sputum production.   Cardiovascular: Negative.   Gastrointestinal: Negative.   Genitourinary: Negative.   Musculoskeletal: Negative.   Neurological: Negative.      Objective:   Today's Vitals: BP (!) 147/77 (BP Location: Right Arm, Patient Position: Sitting, Cuff Size: Normal)  Pulse 77   Temp 97.9 F (36.6 C) (Temporal)   Resp 18   Ht 5' 11"  (1.803 m)   Wt 194 lb (88 kg)   SpO2 98%   BMI 27.06 kg/m  Vitals with BMI 01/14/2020 01/04/2020 11/05/2019  Height 5' 11"  5' 11"  5' 11"   Weight 194 lbs 194 lbs 196 lbs  BMI 27.07 16.10 96.04  Systolic 540 981 191  Diastolic 77 478 96  Pulse 77 - -     Physical Exam Vitals reviewed.  Constitutional:      General: He is not in acute distress.    Appearance: Normal appearance. He is not ill-appearing.  HENT:     Head: Normocephalic and atraumatic.     Right Ear: Tympanic membrane, ear canal and external ear normal.     Left Ear: Tympanic membrane, ear canal and external ear normal.  Eyes:     General: No scleral icterus.    Extraocular Movements: Extraocular movements intact.     Conjunctiva/sclera:  Conjunctivae normal.     Pupils: Pupils are equal, round, and reactive to light.  Neck:     Vascular: No carotid bruit.  Cardiovascular:     Rate and Rhythm: Normal rate and regular rhythm.     Pulses: Normal pulses.     Heart sounds: Normal heart sounds.  Pulmonary:     Effort: Pulmonary effort is normal.     Breath sounds: Normal breath sounds.  Abdominal:     General: Bowel sounds are normal. There is no distension.     Palpations: There is no mass.     Tenderness: There is no abdominal tenderness.     Hernia: No hernia is present.  Musculoskeletal:        General: No swelling or tenderness.     Cervical back: Normal range of motion and neck supple. No rigidity.  Lymphadenopathy:     Cervical: No cervical adenopathy.  Skin:    General: Skin is warm and dry.  Neurological:     General: No focal deficit present.     Mental Status: He is alert and oriented to person, place, and time.     Cranial Nerves: No cranial nerve deficit.     Sensory: No sensory deficit.     Motor: No weakness.     Gait: Gait normal.  Psychiatric:        Mood and Affect: Mood normal.        Behavior: Behavior normal.        Judgment: Judgment normal.          Assessment and Plan   1. Encounter for general adult medical examination with abnormal findings   2. Hypertension, unspecified type   3. Essential hypertension   4. Vitamin D deficiency   5. Hyperlipidemia, unspecified hyperlipidemia type   6. Screening for thyroid disorder   7. Snoring   8. Cough      Plan: 1.  We will hold off on flu shot per his request.  Overall exam was within normal limits. 2., 3.  Blood pressure remains above goal per shared decision making will increase amlodipine to 5 mg daily.  He was told if he experiences any lower extremity swelling to let us know.  He tells me he understands. 4.  We will check serum vitamin D level for further evaluation. 5.,  6.  We will check blood work today. 7.  We did discuss  snoring and witnessed apneic episodes make me consider that  he most likely has sleep apnea.  We did discuss the importance of undergoing sleep study and treating sleep apnea if it is present.  We did discuss how sleep apnea does have many comorbidities and has been associated with pain such as uncontrolled hypertension and Alzheimer's.  He is not willing to undergo sleep study at this time, but will tell me if he changes his mind. 8.  Cough seems to be improving as well as his other upper respiratory infection symptoms.  For now we will not do further diagnostics, but did encourage him to consider trying Flonase nasal spray which she can get over-the-counter.  He was told that if his symptoms return or worsen to let me know which point we can consider getting a chest x-ray.   Tests ordered Orders Placed This Encounter  Procedures  . CMP with eGFR([facility])  . Hemoglobin A1c  . Lipid Panel  . CBC with Differential/Platelets  . TSH  . Vitamin D, 25-hydroxy      Meds ordered this encounter  Medications  . amLODipine (NORVASC) 5 MG tablet    Sig: Take 1 tablet (5 mg total) by mouth daily.    Dispense:  90 tablet    Refill:  1    Order Specific Question:   Supervising Provider    Answer:   Hurshel Party C [[id number]]  . chlorthalidone (HYGROTON) 50 MG tablet    Sig: Take 1 tablet (50 mg total) by mouth daily.    Dispense:  90 tablet    Refill:  1    Order Specific Question:   Supervising Provider    Answer:   Doree Albee [[id number]]    Patient to follow-up in 1 month to monitor blood pressure control, or sooner as needed.  In addition to performing his annual physical exam I also performed an office visit to address his chronic conditions and acute concerns.  Ailene Ards, NP

## 2020-01-14 NOTE — Patient Instructions (Signed)
Flonase Nasal spray - spray 2 sprays into each nare daily as needed for nasal congestion and post-nasal drip

## 2020-02-12 ENCOUNTER — Ambulatory Visit (INDEPENDENT_AMBULATORY_CARE_PROVIDER_SITE_OTHER): Payer: BC Managed Care – PPO | Admitting: Internal Medicine

## 2020-02-13 ENCOUNTER — Telehealth (INDEPENDENT_AMBULATORY_CARE_PROVIDER_SITE_OTHER): Payer: Self-pay

## 2020-02-13 NOTE — Telephone Encounter (Signed)
Alejandro Campbell has ordered the following: CBC, complete metabolic panel, thyroid, hemoglobin A1c, lipid panel and vitamin D levels.

## 2020-02-13 NOTE — Telephone Encounter (Signed)
we reschedule missed appt. But pt isasking for what bld work is going to be done. Then he can see what insurance will pay for on patient.

## 2020-02-14 ENCOUNTER — Ambulatory Visit (INDEPENDENT_AMBULATORY_CARE_PROVIDER_SITE_OTHER): Payer: Managed Care, Other (non HMO) | Admitting: Internal Medicine

## 2020-03-25 ENCOUNTER — Other Ambulatory Visit: Payer: Self-pay

## 2020-03-25 ENCOUNTER — Encounter (INDEPENDENT_AMBULATORY_CARE_PROVIDER_SITE_OTHER): Payer: Self-pay | Admitting: Internal Medicine

## 2020-03-25 ENCOUNTER — Ambulatory Visit (INDEPENDENT_AMBULATORY_CARE_PROVIDER_SITE_OTHER): Payer: BC Managed Care – PPO | Admitting: Internal Medicine

## 2020-03-25 VITALS — BP 110/74 | HR 91 | Temp 98.1°F | Ht 71.0 in | Wt 195.6 lb

## 2020-03-25 DIAGNOSIS — R7303 Prediabetes: Secondary | ICD-10-CM

## 2020-03-25 DIAGNOSIS — I10 Essential (primary) hypertension: Secondary | ICD-10-CM | POA: Diagnosis not present

## 2020-03-25 DIAGNOSIS — E785 Hyperlipidemia, unspecified: Secondary | ICD-10-CM | POA: Diagnosis not present

## 2020-03-25 DIAGNOSIS — E559 Vitamin D deficiency, unspecified: Secondary | ICD-10-CM | POA: Diagnosis not present

## 2020-03-25 MED ORDER — AMLODIPINE BESYLATE 5 MG PO TABS: 5.0000 mg | ORAL_TABLET | Freq: Every day | ORAL | 1 refills | Status: DC

## 2020-03-25 MED ORDER — CHLORTHALIDONE 50 MG PO TABS
50.0000 mg | ORAL_TABLET | Freq: Every day | ORAL | 1 refills | Status: DC
Start: 2020-03-25 — End: 2020-10-19

## 2020-03-25 NOTE — Patient Instructions (Signed)
ALLERGIES  1.Flonase nasal spray -1 spray each nostril daily 2.Zyrtec daily

## 2020-03-25 NOTE — Progress Notes (Signed)
Metrics: Intervention Frequency ACO  Documented Smoking Status Yearly  Screened one or more times in 24 months  Cessation Counseling or  Active cessation medication Past 24 months  Past 24 months   Guideline developer: UpToDate (See UpToDate for funding source) Date Released: 2014       Wellness Office Visit  Subjective:  Patient ID: Alejandro Campbell, male    DOB: 1961-03-25  Age: 59 y.o. MRN: [id number]  CC: This man comes in for follow-up of hypertension, hyperlipidemia, vitamin D deficiency and prediabetes. HPI  He is doing reasonably well but he appears to have a tickle in his throat every morning with a dry cough.  He describes postnasal drainage constantly. He continues on antihypertensive medications listed below and is tolerating them reasonably well. He does have a history of vitamin D deficiency which is quite severe but is not taking vitamin D3 supplementation. Previously he was also prediabetic with a hemoglobin A1c of 5.8%.  He has not had this checked recently. He also has mild dyslipidemia.  Thankfully, there is no history of coronary artery disease or cerebrovascular disease. He denies any chest pain, dyspnea, palpitations or limb weakness. Past Medical History:  Diagnosis Date  . Hypertension   . Vitamin D deficiency    Past Surgical History:  Procedure Laterality Date  . HIP ARTHROPLASTY    . KNEE ARTHROPLASTY Right      Family History  Problem Relation Age of Onset  . Hypertension Mother   . Hyperlipidemia Mother   . CVA Mother   . Renal Disease Father        ESRD  . Hypertension Brother     Social History   Social History Narrative  . Not on file   Social History   Tobacco Use  . Smoking status: Former Smoker    Quit date: 04/05/1998    Years since quitting: 21.9  . Smokeless tobacco: Never Used  Substance Use Topics  . Alcohol use: Not on file    Comment: occasional    Current Meds  Medication Sig  . calcium carbonate (TUMS EX) 750 MG  chewable tablet Chew 1 tablet by mouth as needed for heartburn.  . [DISCONTINUED] amLODipine (NORVASC) 5 MG tablet Take 1 tablet (5 mg total) by mouth daily.  . [DISCONTINUED] chlorthalidone (HYGROTON) 50 MG tablet Take 1 tablet (50 mg total) by mouth daily.      Depression screen [facility] 2/9 03/25/2020 04/09/2019  Decreased Interest 0 0  Down, Depressed, Hopeless 0 0  PHQ - 2 Score 0 0  Altered sleeping 0 -  Tired, decreased energy 0 -  Change in appetite 0 -  Feeling bad or failure about yourself  0 -  Trouble concentrating 0 -  Moving slowly or fidgety/restless 0 -  Suicidal thoughts 0 -  PHQ-9 Score 0 -  Difficult doing work/chores Not difficult at all -     Objective:   Today's Vitals: BP 110/74   Pulse 91   Temp 98.1 F (36.7 C) (Temporal)   Ht 5\' 11"  (1.803 m)   Wt 195 lb 9.6 oz (88.7 kg)   SpO2 97%   BMI 27.28 kg/m  Vitals with BMI 03/25/2020 01/14/2020 01/04/2020  Height 5\' 11"  5\' 11"  5\' 11"   Weight 195 lbs 10 oz 194 lbs 194 lbs  BMI 27.29 27.07 27.07  Systolic 110 147 03/05/2020  Diastolic 74 77 110  Pulse 91 77 -     Physical Exam  He looks systemically well.  Blood pressure is excellent today.  He is alert and orientated without any focal logical signs.     Assessment   1. Essential hypertension   2. Hyperlipidemia, unspecified hyperlipidemia type   3. Vitamin D deficiency   4. Hypertension, unspecified type   5. Prediabetes       Tests ordered Orders Placed This Encounter  Procedures  . COMPLETE METABOLIC PANEL WITH GFR  . Hemoglobin A1c     Plan: 1. He will continue with the amlodipine and chlorthalidone for his hypertension.  He has good control now. 2. I have thoroughly encouraged him to take vitamin D3 10,000 units daily. 3. I believe he has allergies and postnasal drainage which is causing the cough so I recommended Flonase and allergy medicine such as Zyrtec over-the-counter. 4. Blood work is ordered. 5. Further recommendations will  depend on blood results and I will have him follow-up with Sarah in about 6 months.   Meds ordered this encounter  Medications  . amLODipine (NORVASC) 5 MG tablet    Sig: Take 1 tablet (5 mg total) by mouth daily.    Dispense:  90 tablet    Refill:  1  . chlorthalidone (HYGROTON) 50 MG tablet    Sig: Take 1 tablet (50 mg total) by mouth daily.    Dispense:  90 tablet    Refill:  1    Autry Droege Normajean Glasgow, MD

## 2020-03-26 LAB — HEMOGLOBIN A1C
Hgb A1c MFr Bld: 5.9 % of total Hgb — ABNORMAL HIGH (ref <5.7)
Mean Plasma Glucose: 123 mg/dL
eAG (mmol/L): 6.8 mmol/L

## 2020-03-26 LAB — COMPLETE METABOLIC PANEL WITH GFR
AG Ratio: 1.7 (calc) (ref 1.0–2.5)
ALT: 52 U/L — ABNORMAL HIGH (ref 9–46)
AST: 34 U/L (ref 10–35)
Albumin: 4.3 g/dL (ref 3.6–5.1)
Alkaline phosphatase (APISO): 49 U/L (ref 35–144)
BUN: 14 mg/dL (ref 7–25)
CO2: 34 mmol/L — ABNORMAL HIGH (ref 20–32)
Calcium: 9.8 mg/dL (ref 8.6–10.3)
Chloride: 97 mmol/L — ABNORMAL LOW (ref 98–110)
Creat: 1.11 mg/dL (ref 0.70–1.33)
GFR, Est African American: 84 mL/min/{1.73_m2} (ref >=60)
GFR, Est Non African American: 72 mL/min/{1.73_m2} (ref >=60)
Globulin: 2.6 g/dL (calc) (ref 1.9–3.7)
Glucose, Bld: 81 mg/dL (ref 65–139)
Potassium: 3.5 mmol/L (ref 3.5–5.3)
Sodium: 139 mmol/L (ref 135–146)
Total Bilirubin: 0.6 mg/dL (ref 0.2–1.2)
Total Protein: 6.9 g/dL (ref 6.1–8.1)

## 2020-03-31 NOTE — Progress Notes (Signed)
Please call this patient and read to him what I sent him on MYChart,thanks.

## 2020-04-17 ENCOUNTER — Other Ambulatory Visit: Payer: BC Managed Care – PPO

## 2020-04-17 DIAGNOSIS — Z20822 Contact with and (suspected) exposure to covid-19: Secondary | ICD-10-CM

## 2020-04-20 LAB — NOVEL CORONAVIRUS, NAA: SARS-CoV-2, NAA: DETECTED — AB

## 2020-06-24 ENCOUNTER — Ambulatory Visit (INDEPENDENT_AMBULATORY_CARE_PROVIDER_SITE_OTHER): Payer: BC Managed Care – PPO | Admitting: Nurse Practitioner

## 2020-06-24 ENCOUNTER — Other Ambulatory Visit: Payer: Self-pay

## 2020-06-24 ENCOUNTER — Ambulatory Visit (HOSPITAL_COMMUNITY)
Admission: RE | Admit: 2020-06-24 | Discharge: 2020-06-24 | Disposition: A | Discharge status: Home / Self Care | Payer: BC Managed Care – PPO | Source: Ambulatory Visit | Attending: Nurse Practitioner | Admitting: Nurse Practitioner

## 2020-06-24 ENCOUNTER — Encounter (INDEPENDENT_AMBULATORY_CARE_PROVIDER_SITE_OTHER): Payer: Self-pay | Admitting: Nurse Practitioner

## 2020-06-24 VITALS — BP 130/81 | HR 87 | Temp 97.7°F | Resp 18 | Ht 71.0 in | Wt 196.0 lb

## 2020-06-24 DIAGNOSIS — R059 Cough, unspecified: Secondary | ICD-10-CM | POA: Diagnosis not present

## 2020-06-24 MED ORDER — OMEPRAZOLE 20 MG PO CPDR: 20.0000 mg | DELAYED_RELEASE_CAPSULE | Freq: Every day | ORAL | 1 refills | Status: AC

## 2020-06-24 NOTE — Progress Notes (Signed)
Subjective:  Patient ID: Alejandro Campbell, male    DOB: [birth year]  Age: 60 y.o. MRN: [id number]  CC:  Chief Complaint  Patient presents with  . Cough      HPI  This patient arrives today for an acute visit for the above.  He tells me he has been having a cough intermittently over the last 4 to 5 months.  He reports it has a dry cough and especially gets triggered with talking.  He tells me it comes and goes but he is also noticed some increased shortness of breath especially with exertion.  He also mentions some pain to bilateral flank areas that started about 2 weeks ago when he was moving furniture and appliances.  He tells me the pain to his flank area has slowly started to get better.  He does have a history of reflux but this is intermittent, he is not currently taking any medication for his reflux.  He also has a history of smoking.  He tells me he quit smoking greater than 20 years ago.  He tells me he smoked for a total of approximately 20 years and approximately 1/2 pack a day for that time.  This would make his pack year approximately 10.  Past Medical History:  Diagnosis Date  . Hypertension   . Vitamin D deficiency       Family History  Problem Relation Age of Onset  . Hypertension Mother   . Hyperlipidemia Mother   . CVA Mother   . Renal Disease Father        ESRD  . Hypertension Brother     Social History   Social History Narrative  . Not on file   Social History   Tobacco Use  . Smoking status: Former Smoker    Quit date: 04/05/1998    Years since quitting: 22.2  . Smokeless tobacco: Never Used  Substance Use Topics  . Alcohol use: Not on file    Comment: occasional     Current Meds  Medication Sig  . amLODipine (NORVASC) 5 MG tablet Take 1 tablet (5 mg total) by mouth daily.  . calcium carbonate (TUMS EX) 750 MG chewable tablet Chew 1 tablet by mouth as needed for heartburn.  . chlorthalidone (HYGROTON) 50 MG tablet Take 1 tablet (50 mg  total) by mouth daily.  . Cholecalciferol (VITAMIN D3) 1.25 MG (50000 UT) CAPS Take 1 capsule by mouth daily at 6 (six) AM.  . omeprazole (PRILOSEC) 20 MG capsule Take 1 capsule (20 mg total) by mouth daily.    ROS:  Review of Systems  Constitutional: Negative for fever, malaise/fatigue and weight loss.  HENT: Positive for congestion.   Respiratory: Positive for cough and shortness of breath (with exertion). Negative for sputum production and wheezing.   Cardiovascular: Negative for chest pain, orthopnea and leg swelling.     Objective:   Today's Vitals: BP 130/81 (BP Location: Left Arm, Patient Position: Sitting, Cuff Size: Normal)   Pulse 87   Temp 97.7 F (36.5 C) (Temporal)   Resp 18   Ht 5\' 11"  (1.803 m)   Wt 196 lb (88.9 kg)   SpO2 96%   BMI 27.34 kg/m  Vitals with BMI 06/24/2020 03/25/2020 01/14/2020  Height 5\' 11"  5\' 11"  5\' 11"   Weight 196 lbs 195 lbs 10 oz 194 lbs  BMI 27.35 27.29 27.07  Systolic 130 110 03/15/2020  Diastolic 81 74 77  Pulse 87 91 77  Physical Exam Vitals reviewed.  Constitutional:      Appearance: Normal appearance.  HENT:     Head: Normocephalic and atraumatic.  Cardiovascular:     Rate and Rhythm: Normal rate and regular rhythm.  Pulmonary:     Effort: Pulmonary effort is normal.     Breath sounds: Normal breath sounds.  Musculoskeletal:     Cervical back: Neck supple.  Skin:    General: Skin is warm and dry.  Neurological:     Mental Status: He is alert and oriented to person, place, and time.  Psychiatric:        Mood and Affect: Mood normal.        Behavior: Behavior normal.        Thought Content: Thought content normal.        Judgment: Judgment normal.          Assessment and Plan   1. Cough      Plan: 1.  Uncertain etiology at this time.  We will send him for x-ray for further evaluation.  We also discussed possibly trialing an antacid to see if his cough may be related to silent GERD.  Prescription for omeprazole  1 capsule daily sent to patient's pharmacy.  We also discussed possibly sending patient to pulmonology for further evaluation, however patient is agreeable to waiting for chest x-ray results as well as to see if the antacid helps his symptoms prior to going to see a pulmonologist.  We will have him follow-up in 6 weeks which may change pending x-ray results.   Tests ordered Orders Placed This Encounter  Procedures  . DG Chest 2 View      Meds ordered this encounter  Medications  . omeprazole (PRILOSEC) 20 MG capsule    Sig: Take 1 capsule (20 mg total) by mouth daily.    Dispense:  30 capsule    Refill:  1    Order Specific Question:   Supervising Provider    Answer:   Wilson Singer [[id number]]    Patient to follow-up in 6 weeks or sooner as needed.  Elenore Paddy, NP

## 2020-08-13 ENCOUNTER — Ambulatory Visit (INDEPENDENT_AMBULATORY_CARE_PROVIDER_SITE_OTHER): Payer: BC Managed Care – PPO | Admitting: Nurse Practitioner

## 2020-08-13 ENCOUNTER — Encounter (INDEPENDENT_AMBULATORY_CARE_PROVIDER_SITE_OTHER): Payer: Self-pay | Admitting: Nurse Practitioner

## 2020-08-13 ENCOUNTER — Other Ambulatory Visit: Payer: Self-pay

## 2020-08-13 VITALS — BP 128/80 | HR 85 | Temp 96.7°F | Ht 71.0 in | Wt 196.6 lb

## 2020-08-13 DIAGNOSIS — I459 Conduction disorder, unspecified: Secondary | ICD-10-CM

## 2020-08-13 DIAGNOSIS — R059 Cough, unspecified: Secondary | ICD-10-CM

## 2020-08-13 DIAGNOSIS — I10 Essential (primary) hypertension: Secondary | ICD-10-CM

## 2020-08-13 NOTE — Progress Notes (Signed)
Subjective:  Patient ID: Alejandro Campbell, male    DOB: [birth year]  Age: 60 y.o. MRN: [id number]  CC:  Chief Complaint  Patient presents with  . Follow-up    Doing well, no concerns  . Cough  . Hypertension      HPI  This patient arrives today for the above.  Cough: He is here for follow-up of cough.  We did chest x-ray which was negative.  We trialed omeprazole and tells me that this is improved heartburn symptoms and his cough has essentially resolved.  He is not taking omeprazole regularly any longer.  He does take Tums as needed for heartburn.  He does mention that sometimes when he swallows food it was feels like it gets stuck deep in his chest but he denies any significant pain with swallowing.  He also denies any unintentional weight loss.  Hypertension: He continues on chlorthalidone and amlodipine for treatment of his hypertension.  Of note while the patient was being roomed my medical assistant stated that she felt the patient may have had a skipped heartbeat when she was checking his blood pressure.  EKG was ordered.  Past Medical History:  Diagnosis Date  . Hypertension   . Vitamin D deficiency       Family History  Problem Relation Age of Onset  . Hypertension Mother   . Hyperlipidemia Mother   . CVA Mother   . Renal Disease Father        ESRD  . Hypertension Brother     Social History   Social History Narrative  . Not on file   Social History   Tobacco Use  . Smoking status: Former Smoker    Quit date: 04/05/1998    Years since quitting: 22.3  . Smokeless tobacco: Never Used  Substance Use Topics  . Alcohol use: Not on file    Comment: occasional     Current Meds  Medication Sig  . amLODipine (NORVASC) 5 MG tablet Take 1 tablet (5 mg total) by mouth daily.  . calcium carbonate (TUMS EX) 750 MG chewable tablet Chew 1 tablet by mouth as needed for heartburn.  . chlorthalidone (HYGROTON) 50 MG tablet Take 1 tablet (50 mg total) by mouth  daily.  . Cholecalciferol (VITAMIN D3) 125 MCG (5000 UT) CAPS Take 1 capsule by mouth daily.    ROS:  Review of Systems  Eyes: Negative for blurred vision.  Respiratory: Negative for shortness of breath.   Cardiovascular: Negative for chest pain.  Neurological: Negative for dizziness and headaches.     Objective:   Today's Vitals: BP 128/80   Pulse 85   Temp (!) 96.7 F (35.9 C) (Temporal)   Ht 5' 11"  (1.803 m)   Wt 196 lb 9.6 oz (89.2 kg)   SpO2 97%   BMI 27.42 kg/m  Vitals with BMI 08/13/2020 06/24/2020 03/25/2020  Height 5' 11"  5' 11"  5' 11"   Weight 196 lbs 10 oz 196 lbs 195 lbs 10 oz  BMI 27.43 56.97 94.80  Systolic 165 537 482  Diastolic 80 81 74  Pulse 85 87 91     Physical Exam Vitals reviewed.  Constitutional:      Appearance: Normal appearance.  HENT:     Head: Normocephalic and atraumatic.  Cardiovascular:     Rate and Rhythm: Normal rate and regular rhythm.  Pulmonary:     Effort: Pulmonary effort is normal.     Breath sounds: Normal breath sounds.  Musculoskeletal:  Cervical back: Neck supple.  Skin:    General: Skin is warm and dry.  Neurological:     Mental Status: He is alert and oriented to person, place, and time.  Psychiatric:        Mood and Affect: Mood normal.        Behavior: Behavior normal.        Thought Content: Thought content normal.        Judgment: Judgment normal.          Assessment and Plan   1. Skipped beats   2. Essential hypertension   3. Cough      Plan: 1.  EKG without significant abnormalities.  Rhythm normal sinus. 2.  Blood pressure well controlled on current regimen, he will continue taking medication as prescribed. 3.  Cough has resolved.  He was encouraged to let me know if cough returns.  We also discussed that if that feeling of the food getting stuck in his esophagus occurs more frequently or if he starts to have pain with this seems to be evaluated by gastroenterology.  He tells me currently now  it only happens every once in a while does not seem to bother him too much, however he does express understanding that he will let me know if this starts to occur more frequently or worsens in any way.   Tests ordered Orders Placed This Encounter  Procedures  . CMP with eGFR([facility])  . EKG 12-Lead      No orders of the defined types were placed in this encounter.   Patient to follow-up in 3 months or sooner as needed.  Ailene Ards, NP

## 2020-08-14 LAB — COMPLETE METABOLIC PANEL WITH GFR
AG Ratio: 2 (calc) (ref 1.0–2.5)
ALT: 48 U/L — ABNORMAL HIGH (ref 9–46)
AST: 29 U/L (ref 10–35)
Albumin: 4.6 g/dL (ref 3.6–5.1)
Alkaline phosphatase (APISO): 51 U/L (ref 35–144)
BUN: 15 mg/dL (ref 7–25)
CO2: 31 mmol/L (ref 20–32)
Calcium: 9.8 mg/dL (ref 8.6–10.3)
Chloride: 98 mmol/L (ref 98–110)
Creat: 1.13 mg/dL (ref 0.70–1.33)
GFR, Est African American: 82 mL/min/{1.73_m2} (ref >=60)
GFR, Est Non African American: 71 mL/min/{1.73_m2} (ref >=60)
Globulin: 2.3 g/dL (calc) (ref 1.9–3.7)
Glucose, Bld: 77 mg/dL (ref 65–139)
Potassium: 3.6 mmol/L (ref 3.5–5.3)
Sodium: 139 mmol/L (ref 135–146)
Total Bilirubin: 0.5 mg/dL (ref 0.2–1.2)
Total Protein: 6.9 g/dL (ref 6.1–8.1)

## 2020-09-24 ENCOUNTER — Ambulatory Visit (INDEPENDENT_AMBULATORY_CARE_PROVIDER_SITE_OTHER): Payer: BC Managed Care – PPO | Admitting: Nurse Practitioner

## 2020-10-19 ENCOUNTER — Other Ambulatory Visit (INDEPENDENT_AMBULATORY_CARE_PROVIDER_SITE_OTHER): Payer: Self-pay | Admitting: Internal Medicine

## 2020-10-19 DIAGNOSIS — I10 Essential (primary) hypertension: Secondary | ICD-10-CM

## 2020-11-20 ENCOUNTER — Ambulatory Visit (INDEPENDENT_AMBULATORY_CARE_PROVIDER_SITE_OTHER): Payer: BC Managed Care – PPO | Admitting: Nurse Practitioner

## 2021-04-13 DIAGNOSIS — Z0189 Encounter for other specified special examinations: Secondary | ICD-10-CM | POA: Diagnosis not present

## 2021-04-13 DIAGNOSIS — I10 Essential (primary) hypertension: Secondary | ICD-10-CM | POA: Diagnosis not present

## 2021-04-13 DIAGNOSIS — M545 Low back pain, unspecified: Secondary | ICD-10-CM | POA: Diagnosis not present

## 2021-04-27 DIAGNOSIS — I10 Essential (primary) hypertension: Secondary | ICD-10-CM | POA: Diagnosis not present

## 2021-04-27 DIAGNOSIS — Z0000 Encounter for general adult medical examination without abnormal findings: Secondary | ICD-10-CM | POA: Diagnosis not present

## 2021-05-11 DIAGNOSIS — I10 Essential (primary) hypertension: Secondary | ICD-10-CM | POA: Diagnosis not present

## 2021-05-11 DIAGNOSIS — R7989 Other specified abnormal findings of blood chemistry: Secondary | ICD-10-CM | POA: Diagnosis not present

## 2021-05-11 DIAGNOSIS — R7303 Prediabetes: Secondary | ICD-10-CM | POA: Diagnosis not present

## 2021-05-11 DIAGNOSIS — M545 Low back pain, unspecified: Secondary | ICD-10-CM | POA: Diagnosis not present

## 2021-05-15 ENCOUNTER — Encounter: Payer: Self-pay | Admitting: Orthopaedic Surgery

## 2021-06-08 ENCOUNTER — Encounter: Payer: Self-pay | Admitting: Orthopedic Surgery

## 2021-06-08 ENCOUNTER — Ambulatory Visit: Payer: BC Managed Care – PPO

## 2021-06-08 ENCOUNTER — Other Ambulatory Visit: Payer: Self-pay

## 2021-06-08 ENCOUNTER — Ambulatory Visit: Payer: BC Managed Care – PPO | Admitting: Orthopedic Surgery

## 2021-06-08 VITALS — BP 125/88 | HR 92 | Ht 71.0 in | Wt 196.8 lb

## 2021-06-08 DIAGNOSIS — G8929 Other chronic pain: Secondary | ICD-10-CM | POA: Diagnosis not present

## 2021-06-08 DIAGNOSIS — M5442 Lumbago with sciatica, left side: Secondary | ICD-10-CM

## 2021-06-08 DIAGNOSIS — M5441 Lumbago with sciatica, right side: Secondary | ICD-10-CM | POA: Diagnosis not present

## 2021-06-08 DIAGNOSIS — M48062 Spinal stenosis, lumbar region with neurogenic claudication: Secondary | ICD-10-CM | POA: Diagnosis not present

## 2021-06-08 NOTE — Progress Notes (Signed)
EVALUATION AND MANAGEMENT  ? ?Type of appointment : New ? ?PLAN:  ?Physical therapy ? ?MRI lumbar spine ? ?Presumptive diagnosis ? ?Encounter Diagnoses  ?Name Primary?  ? Chronic bilateral low back pain with bilateral sciatica Yes  ? Spinal stenosis of lumbar region with neurogenic claudication   ? ? ? ?No orders of the defined types were placed in this encounter. ? ? ? ?Chief Complaint  ?Patient presents with  ? Back Pain  ?  LBP//painful x 1 year just recently gotten worse and radiating down left leg ?**has tried chiropractor for several months and not getting any relief ?Has been on prednisone and muscle relaxer (Robaxin) and as soon as he stopped taking it, the pain returned  ? ? ?Alejandro Campbell is 61 years old he had back pain for year he was treated with a chiropractor prednisone Dosepak and muscle relaxers.  He has 10 out of 10 pain including increased pain at night ? ?When he is walking his pain increases to 10 out of 10 as well and he can stop it by bending forward for several minutes he gets increased pain standing relieved by sitting he describes a dull tooth ache with bilateral leg pain on the left side radiating to his left foot ? ? ?ROS pain in legs after walking and back pain were his only review of system complaints ? ? ?Body mass index is 27.45 kg/m?. ? ?Physical Exam ?Constitutional:   ?   General: He is not in acute distress. ?   Appearance: He is well-developed.  ?   Comments: Well developed, well nourished ?Normal grooming and hygiene  ? ?  ?Cardiovascular:  ?   Comments: No peripheral edema ?Musculoskeletal:  ?   Comments: He is tender on the left side of his back not on the right and not in the midline he has no pain with flexion but gets increased pain with extension his neurovascular exam was normal including normal reflexes normal strength normal pinwheel test and normal range of motion of his left hip  ?Skin: ?   General: Skin is warm and dry.  ?Neurological:  ?   Mental Status: He is alert and  oriented to person, place, and time.  ?   Sensory: No sensory deficit.  ?   Coordination: Coordination normal.  ?   Gait: Gait normal.  ?   Deep Tendon Reflexes: Reflexes are normal and symmetric.  ?Psychiatric:     ?   Mood and Affect: Mood normal.     ?   Behavior: Behavior normal.     ?   Thought Content: Thought content normal.     ?   Judgment: Judgment normal.  ?   Comments: Affect normal   ? ? ?Past Medical History:  ?Diagnosis Date  ? Hypertension   ? Vitamin D deficiency   ? ?Past Surgical History:  ?Procedure Laterality Date  ? HIP ARTHROPLASTY    ? KNEE ARTHROPLASTY Right   ? ?Social History  ? ?Tobacco Use  ? Smoking status: Former  ?  Types: Cigarettes  ?  Quit date: 04/05/1998  ?  Years since quitting: 23.1  ? Smokeless tobacco: Never  ?Vaping Use  ? Vaping Use: Never used  ?Substance Use Topics  ? Drug use: Not Currently  ? ? ? ?Assessment and Plan: ? ?X-rays showed a fairly normal-looking lumbar spine with some anterior lipping over the fourth and fifth superior endplates some mild facet arthrosis mild shift of the spine to the  right on the [facility] x-ray slight loss in lumbar lordosis ? ?Impression was mild spondylosis ? ?Encounter Diagnoses  ?Name Primary?  ? Chronic bilateral low back pain with bilateral sciatica Yes  ? Spinal stenosis of lumbar region with neurogenic claudication   ? ?

## 2021-06-08 NOTE — Patient Instructions (Signed)
Physical therapy will be ordered and we can start that immediately ? ?MRI will be ordered of the lumbar spine Dr. Romeo Apple will call you with the results ? ? ?

## 2021-07-01 ENCOUNTER — Ambulatory Visit (HOSPITAL_COMMUNITY): Payer: BC Managed Care – PPO | Attending: Orthopedic Surgery

## 2021-07-01 DIAGNOSIS — M5459 Other low back pain: Secondary | ICD-10-CM | POA: Diagnosis not present

## 2021-07-01 DIAGNOSIS — M48062 Spinal stenosis, lumbar region with neurogenic claudication: Secondary | ICD-10-CM | POA: Diagnosis not present

## 2021-07-01 NOTE — Therapy (Addendum)
?OUTPATIENT PHYSICAL THERAPY THORACOLUMBAR EVALUATION ? ? ?Patient Name: Alejandro Campbell ?MRN: [id number] ?DOB:May 25, 1960, 61 y.o., male ?Today's Date: 07/01/2021 ? ? PT End of Session - 07/01/21 1641   ? ? Visit Number 1   ? Number of Visits 4   ? Authorization Type [employer] PPO   ? PT Start Time 1545   ? PT Stop Time 1639   ? PT Time Calculation (min) 54 min   ? ?  ?  ? ?  ? ? ?Past Medical History:  ?Diagnosis Date  ? Hypertension   ? Vitamin D deficiency   ? ?Past Surgical History:  ?Procedure Laterality Date  ? HIP ARTHROPLASTY    ? KNEE ARTHROPLASTY Right   ? ?Patient Active Problem List  ? Diagnosis Date Noted  ? Encounter for general adult medical examination with abnormal findings 04/09/2019  ? Prediabetes 04/09/2019  ? Essential hypertension 03/16/2019  ? Hypertension   ? Vitamin D deficiency   ? Sore throat 03/28/2018  ? UPPER RESPIRATORY INFECTION, VIRAL 04/16/2008  ? ALLERGIC RHINITIS 07/17/2007  ? MIGRAINE HEADACHE 02/03/2007  ? VERTIGO 02/03/2007  ? ELEVATED BLOOD PRESSURE WITHOUT DIAGNOSIS OF HYPERTENSION 02/03/2007  ? ? ?PCP: Benita Stabile, MD ? ?REFERRING PROVIDER: Vickki Hearing, MD ? ?REFERRING DIAG: N36.144 (ICD-10-CM) - Spinal stenosis of lumbar region with neurogenic claudication  ? ?THERAPY DIAG:  ?Spinal stenosis of lumbar region with neurogenic claudication ? ?Other low back pain ? ?ONSET DATE: Spring of 2022 ? ?SUBJECTIVE:                                                                                                                                                                ? ?SUBJECTIVE STATEMENT: ?Patient reports pain that started in his low back about a year ago.  He reports he has numbness and pain in the Left leg after standing 5-10 min. He has been seeing a chiropractor but not much change. Has some pain at night but goes away if he lays flat on his back.  ? ?PERTINENT HISTORY:  ?none ? ?PAIN:  ?Are you having pain? Yes: NPRS scale: 3/10 ?Pain location: low back and left  leg ?Pain description: ache in back; Left numb and burning and 5th toe ?Aggravating factors: standing, walking ?Relieving factors: bending my back; flexion ? ? ?PRECAUTIONS: None ? ?WEIGHT BEARING RESTRICTIONS No ? ?FALLS:  ?Has patient fallen in last 6 months? No ? ?LIVING ENVIRONMENT: ?Lives with: lives with their family and lives with their spouse ?Lives in: House/apartment ?Stairs: No ?Has following equipment at home: None ? ?OCCUPATION: owns a farm, horses; maintenance for [employer] ? ?PLOF: Independent ? ?PATIENT GOALS quite hurting ? ? ?OBJECTIVE:  ? ?DIAGNOSTIC FINDINGS:  ?X-ray; having a MRI Friday ? ?PATIENT SURVEYS:  ?  FOTO 55 ? ?SCREENING FOR RED FLAGS: ?Bowel or bladder incontinence: No ?Spinal tumors: No ?Cauda equina syndrome: No ?Compression fracture: No ?Abdominal aneurysm: No ? ?COGNITION: ? Overall cognitive status: Within functional limits for tasks assessed   ?  ?SENSATION: ?WFL ? ?POSTURE:  ?Sits in chair forward flexed ? ?PALPATION: ?Tender low back paraspinals Left > Right ? ?LUMBAR ROM:  ? ?Active  A/PROM  ?07/01/2021  ?Flexion 90% available  ?Extension 50% pinch  ?Right lateral flexion 75% available  ?Left lateral flexion 75% available pinch  ?Right rotation 75% availble  ?Left rotation 75% available pinch  ? (Blank rows = not tested) ? ? ?LE MMT: ? ?MMT Right ?07/01/2021 Left ?07/01/2021  ?Hip flexion 5 4+  ?Hip extension    ?Hip abduction    ?Hip adduction    ?Hip internal rotation    ?Hip external rotation    ?Knee flexion 5 5  ?Knee extension 5 4  ?Ankle dorsiflexion 5 5  ?Ankle plantarflexion 5 5  ?Ankle inversion    ?Ankle eversion    ? (Blank rows = not tested) ? ? ? ?GAIT: ?Distance walked: 65 ?Assistive device utilized: None ?Level of assistance: Complete Independence ?Comments: slight limp; decreased stance Left lower extremity ? ? ? ?TODAY'S TREATMENT  ?PT evaluation, HEP instruction , postural education, plan of care ? ? ?PATIENT EDUCATION:  ?Education details: HEP instruction ,  postural education, plan of care ?Person educated: Patient ?Education method: Explanation, Demonstration, and Handouts ?Education comprehension: verbalized understanding, returned demonstration, and needs further education ? ? ?HOME EXERCISE PROGRAM: ?Access Code: [email or web address] ?URL: [email or web address] ?Date: 07/01/2021 ?Prepared by: [facility] ? ?Exercises ?- Prone Press Up On Elbows  - 1 x daily - 7 x weekly - 3 sets - 10 reps - 30 sec hold ?- Prone Press Up  - 5 x daily - 7 x weekly - 1 sets - 10 reps - 2-3 hold ? ?ASSESSMENT: ? ?CLINICAL IMPRESSION: ?Patient is a 61 y.o. male who was seen today for physical therapy evaluation and treatment for back pain with radiating symptoms into his Left leg. E26.834 (ICD-10-CM) - Spinal stenosis of lumbar region with neurogenic claudication  ? ? ?OBJECTIVE IMPAIRMENTS Abnormal gait, decreased activity tolerance, decreased balance, decreased coordination, decreased endurance, decreased knowledge of condition, decreased mobility, difficulty walking, decreased ROM, decreased strength, hypomobility, increased muscle spasms, impaired flexibility, improper body mechanics, postural dysfunction, and pain.  ? ?ACTIVITY LIMITATIONS cleaning, community activity, driving, occupation, and yard work.  ? ? ?REHAB POTENTIAL: Good ? ?CLINICAL DECISION MAKING: Stable/uncomplicated ? ?EVALUATION COMPLEXITY: Low ? ? ?GOALS: ?Goals reviewed with patient? Yes ? ?SHORT TERM GOALS: Target date: 07/15/2021 ? ? Patient with be independent with initial HEP to  improve functional outcomes ? ?Goal status: INITIAL ? ?2.  Patient will report at least 50% improvement in overall symptoms and/or function to demonstrate improved functional mobility ? ? ?Goal status: INITIAL ? ? ?LONG TERM GOALS: Target date: 07/29/2021 ? ?Patient will be independent in self management strategies to improve quality of life and functional outcomes.  ? ?Goal status: INITIAL ? ?2.   Patient will meet predicted FOTO  score to demonstrate improved overall function ?Baseline: 55 ?Goal status: INITIAL ? ?3.  Patient will be able to stand x 30 min to perform job tasks without pain causing him to sit down in order  ?Baseline: 10 min ?Goal status: INITIAL ? ?4.  Patient will be able to walk x 15 min for exercise and to access all areas of  his farm without pain limiting his walk. ?Baseline: 10 ?Goal status: INITIAL ? ?5.  Patient will report at least 75% improvement in overall symptoms and/or function to demonstrate improved functional mobility ? ? ?Goal status: INITIAL ? ?PLAN: ?PT FREQUENCY: 1x/week ? ?PT DURATION: 4 weeks ? ?PLANNED INTERVENTIONS: Therapeutic exercises, Therapeutic activity, Neuromuscular re-education, Balance training, Gait training, Patient/Family education, Joint manipulation, Joint mobilization, Vestibular training, Orthotic/Fit training, DME instructions, Aquatic Therapy, Dry Needling, Cognitive remediation, Electrical stimulation, Spinal manipulation, Spinal mobilization, Cryotherapy, Moist heat, Taping, Traction, Ultrasound, Contrast bath, and Manual therapy. ? ?PLAN FOR NEXT SESSION: Assess patient's reaction to HEP, progress appropriately ? ? ?5:31 PM, 07/01/21 ?Michelena Culmer Small Whyatt Klinger MPT ?[facility] ?[facility] [id number] ?Ph:[phone] ? ? ?

## 2021-07-03 ENCOUNTER — Ambulatory Visit (HOSPITAL_COMMUNITY)
Admission: RE | Admit: 2021-07-03 | Discharge: 2021-07-03 | Disposition: A | Discharge status: Home / Self Care | Payer: BC Managed Care – PPO | Source: Ambulatory Visit | Attending: Orthopedic Surgery | Admitting: Orthopedic Surgery

## 2021-07-03 DIAGNOSIS — M5441 Lumbago with sciatica, right side: Secondary | ICD-10-CM | POA: Diagnosis not present

## 2021-07-03 DIAGNOSIS — M48062 Spinal stenosis, lumbar region with neurogenic claudication: Secondary | ICD-10-CM | POA: Diagnosis not present

## 2021-07-03 DIAGNOSIS — M545 Low back pain, unspecified: Secondary | ICD-10-CM | POA: Diagnosis not present

## 2021-07-03 DIAGNOSIS — M5442 Lumbago with sciatica, left side: Secondary | ICD-10-CM | POA: Insufficient documentation

## 2021-07-03 DIAGNOSIS — G8929 Other chronic pain: Secondary | ICD-10-CM | POA: Insufficient documentation

## 2021-07-07 ENCOUNTER — Telehealth: Payer: Self-pay | Admitting: Orthopedic Surgery

## 2021-07-07 ENCOUNTER — Ambulatory Visit (HOSPITAL_COMMUNITY): Payer: BC Managed Care – PPO

## 2021-07-07 ENCOUNTER — Telehealth (HOSPITAL_COMMUNITY): Payer: Self-pay

## 2021-07-07 NOTE — Telephone Encounter (Signed)
I left Mr. Cabadas a note in my personal number he needs to see Dr. Lorin Mercy when he he can after I talk to him for his foraminal disc protrusion ? ?L4-5: There is a large disc protrusion in the periphery of the left ?foramen extending into the left paravertebral space. There is severe ?left foraminal stenosis and compression of the left L4 root. The ?central canal and right foramen are open. ?  ?L5-S1: Minimal disc bulge without stenosis. ?  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 ?  ?Mild left foraminal stenosis at L2-3 due to a small disc protrusion ?with an annular fissure. ?  ?Mild right foraminal narrowing L3-4. ?  ?  ?Electronically Signed ?  By: Inge Rise M.D. ?  On: 07/04/2021 08:58 ?

## 2021-07-07 NOTE — Telephone Encounter (Signed)
S/w Dr. Romeo Apple and he requested to cx all future appointments as of today until paitent is seen by a neurosurgeon. Patient may need surgery. Dr. Romeo Apple requested for patient to be put on hold until future notice. ?

## 2021-07-07 NOTE — Telephone Encounter (Signed)
I gave Alejandro Campbell results he has a foraminal disc protrusion left side consistent with his symptoms I recommended he see a back specialist and cancel his physical therapy for now I canceled his appointment at PT ?

## 2021-07-08 ENCOUNTER — Other Ambulatory Visit: Payer: Self-pay

## 2021-07-08 DIAGNOSIS — M48062 Spinal stenosis, lumbar region with neurogenic claudication: Secondary | ICD-10-CM

## 2021-07-08 DIAGNOSIS — G8929 Other chronic pain: Secondary | ICD-10-CM

## 2021-07-16 ENCOUNTER — Encounter (HOSPITAL_COMMUNITY): Payer: BC Managed Care – PPO

## 2021-07-21 ENCOUNTER — Telehealth: Payer: Self-pay | Admitting: Orthopedic Surgery

## 2021-07-21 ENCOUNTER — Encounter (HOSPITAL_COMMUNITY): Payer: BC Managed Care – PPO

## 2021-07-21 ENCOUNTER — Other Ambulatory Visit: Payer: Self-pay

## 2021-07-21 DIAGNOSIS — G8929 Other chronic pain: Secondary | ICD-10-CM

## 2021-07-21 DIAGNOSIS — M48062 Spinal stenosis, lumbar region with neurogenic claudication: Secondary | ICD-10-CM

## 2021-07-21 NOTE — Telephone Encounter (Signed)
Sent message to cancel appt with Dr. Ophelia Charter. Referral placed to [facility] per patient request.  ?

## 2021-07-21 NOTE — Telephone Encounter (Signed)
Patient does not want to see Dr. Ophelia Charter orthopedic, he would like you to cancel his appt with him and he wants to go to [facility] and see someone there instead . ? ?He has a friend that is a Engineer, civil (consulting) at [facility] and she told him he needs to see anyone at [facility], BUT Dr. Franky Macho.  ? ?Please call him back at [phone]  ?

## 2021-07-27 ENCOUNTER — Encounter (HOSPITAL_COMMUNITY): Payer: BC Managed Care – PPO

## 2021-07-28 ENCOUNTER — Ambulatory Visit: Payer: BC Managed Care – PPO | Admitting: Orthopaedic Surgery

## 2021-08-06 DIAGNOSIS — Z6826 Body mass index (BMI) 26.0-26.9, adult: Secondary | ICD-10-CM | POA: Diagnosis not present

## 2021-08-06 DIAGNOSIS — M5416 Radiculopathy, lumbar region: Secondary | ICD-10-CM | POA: Diagnosis not present

## 2021-09-29 DIAGNOSIS — M5416 Radiculopathy, lumbar region: Secondary | ICD-10-CM | POA: Diagnosis not present

## 2021-09-29 DIAGNOSIS — M5116 Intervertebral disc disorders with radiculopathy, lumbar region: Secondary | ICD-10-CM | POA: Diagnosis not present

## 2021-09-29 DIAGNOSIS — M5126 Other intervertebral disc displacement, lumbar region: Secondary | ICD-10-CM | POA: Diagnosis not present

## 2021-10-14 DIAGNOSIS — R7301 Impaired fasting glucose: Secondary | ICD-10-CM | POA: Diagnosis not present

## 2021-10-14 DIAGNOSIS — I10 Essential (primary) hypertension: Secondary | ICD-10-CM | POA: Diagnosis not present

## 2021-11-04 DIAGNOSIS — R7303 Prediabetes: Secondary | ICD-10-CM | POA: Diagnosis not present

## 2021-11-04 DIAGNOSIS — I10 Essential (primary) hypertension: Secondary | ICD-10-CM | POA: Diagnosis not present

## 2021-11-04 DIAGNOSIS — R7989 Other specified abnormal findings of blood chemistry: Secondary | ICD-10-CM | POA: Diagnosis not present

## 2021-11-04 DIAGNOSIS — M545 Low back pain, unspecified: Secondary | ICD-10-CM | POA: Diagnosis not present

## 2022-02-16 DIAGNOSIS — I10 Essential (primary) hypertension: Secondary | ICD-10-CM | POA: Diagnosis not present

## 2022-02-16 DIAGNOSIS — Z1211 Encounter for screening for malignant neoplasm of colon: Secondary | ICD-10-CM | POA: Diagnosis not present

## 2022-03-28 ENCOUNTER — Encounter: Payer: Self-pay | Admitting: Emergency Medicine

## 2022-03-28 ENCOUNTER — Ambulatory Visit
Admission: EM | Admit: 2022-03-28 | Discharge: 2022-03-28 | Disposition: A | Discharge status: Home / Self Care | Payer: BC Managed Care – PPO | Attending: Nurse Practitioner | Admitting: Nurse Practitioner

## 2022-03-28 ENCOUNTER — Ambulatory Visit: Payer: Self-pay

## 2022-03-28 DIAGNOSIS — J069 Acute upper respiratory infection, unspecified: Secondary | ICD-10-CM

## 2022-03-28 MED ORDER — PSEUDOEPH-BROMPHEN-DM 30-2-10 MG/5ML PO SYRP: 5.0000 mL | ORAL_SOLUTION | Freq: Four times a day (QID) | ORAL | 0 refills | Status: AC | PRN

## 2022-03-28 NOTE — Discharge Instructions (Signed)
Take medication as prescribed. Increase fluids and allow for plenty of rest. Recommend Tylenol or ibuprofen as needed for pain, fever, or general discomfort. Warm salt water gargles 3-4 times daily to help with throat pain or discomfort. Recommend using a humidifier at bedtime during sleep to help with cough and nasal congestion. Sleep elevated on 2 pillows while cough symptoms persist. As discussed, a viral illness can last anywhere from 10 to 14 days, symptoms should improve between days 5 and 7 since her symptoms onset.  If symptoms improve, but then they suddenly worsen, please follow-up in this clinic for further evaluation.  Also if symptoms do not improve after 10 to 14 days, re-evaluation in this clinic or with your primary care physician.. Follow-up as needed.

## 2022-03-28 NOTE — ED Triage Notes (Signed)
Sore throat, runny nose, headache, fatigue, night sweats since Friday.  States symptoms are better today.  States his wife just had lung surgery and he can't get her sick.Marland Kitchen

## 2022-03-28 NOTE — ED Provider Notes (Signed)
[facility]    CSN: [id number] Arrival date & time: 03/28/22  9470      History   Chief Complaint Chief Complaint  Patient presents with   Cough    HPI Alejandro Campbell is a 61 y.o. male.   The history is provided by the patient.   The patient presents with a 2-day history of sore throat, runny nose, headache, fatigue, and night sweats.  Patient denies known fever, chills, ear pain, wheezing, shortness of breath, difficulty breathing.  Patient reports that he has been taking over-the-counter cough and cold medicine such as NyQuil, states he feels better today.  States that his wife had lung surgery and he is concerned because he is her primary caregiver and cannot be ill.  Past Medical History:  Diagnosis Date   Hypertension    Vitamin D deficiency     Patient Active Problem List   Diagnosis Date Noted   Encounter for general adult medical examination with abnormal findings 04/09/2019   Prediabetes 04/09/2019   Essential hypertension 03/16/2019   Hypertension    Vitamin D deficiency    Sore throat 03/28/2018   UPPER RESPIRATORY INFECTION, VIRAL 04/16/2008   ALLERGIC RHINITIS 07/17/2007   MIGRAINE HEADACHE 02/03/2007   VERTIGO 02/03/2007   ELEVATED BLOOD PRESSURE WITHOUT DIAGNOSIS OF HYPERTENSION 02/03/2007    Past Surgical History:  Procedure Laterality Date   HIP ARTHROPLASTY     KNEE ARTHROPLASTY Right        Home Medications    Prior to Admission medications   Medication Sig Start Date End Date Taking? Authorizing Provider  brompheniramine-pseudoephedrine-DM 30-2-10 MG/5ML syrup Take 5 mLs by mouth 4 (four) times daily as needed. 03/28/22  Yes Eulene Pekar-Warren, Sadie Haber, NP  amLODipine (NORVASC) 5 MG tablet TAKE 1 TABLET(5 MG) BY MOUTH DAILY 10/19/20   Lilly Cove C, MD  calcium carbonate (TUMS EX) 750 MG chewable tablet Chew 1 tablet by mouth as needed for heartburn.    [provider]  chlorthalidone (HYGROTON) 50 MG tablet TAKE 1  TABLET(50 MG) BY MOUTH DAILY 10/19/20   Lilly Cove C, MD  Cholecalciferol (VITAMIN D3) 125 MCG (5000 UT) CAPS Take 1 capsule by mouth daily.    [provider]  omeprazole (PRILOSEC) 20 MG capsule Take 1 capsule (20 mg total) by mouth daily. 06/24/20   Elenore Paddy, NP    Family History Family History  Problem Relation Age of Onset   Hypertension Mother    Hyperlipidemia Mother    CVA Mother    Renal Disease Father        ESRD   Hypertension Brother     Social History Social History   Tobacco Use   Smoking status: Former    Types: Cigarettes    Quit date: 04/05/1998    Years since quitting: 23.9   Smokeless tobacco: Never  Vaping Use   Vaping Use: Never used  Substance Use Topics   Drug use: Not Currently     Allergies   Other   Review of Systems Review of Systems Per HPI  Physical Exam Triage Vital Signs ED Triage Vitals  Enc Vitals Group     BP 03/28/22 0841 (!) 151/95     Pulse Rate 03/28/22 0841 98     Resp 03/28/22 0841 20     Temp 03/28/22 0841 97.9 F (36.6 C)     Temp Source 03/28/22 0841 Oral     SpO2 03/28/22 0841 98 %  Weight --      Height --      Head Circumference --      Peak Flow --      Pain Score 03/28/22 0843 0     Pain Loc --      Pain Edu? --      Excl. in GC? --    No data found.  Updated Vital Signs BP (!) 151/95 (BP Location: Right Arm)   Pulse 98   Temp 97.9 F (36.6 C) (Oral)   Resp 20   SpO2 98%   Visual Acuity Right Eye Distance:   Left Eye Distance:   Bilateral Distance:    Right Eye Near:   Left Eye Near:    Bilateral Near:     Physical Exam Vitals and nursing note reviewed.  Constitutional:      General: He is not in acute distress.    Appearance: Normal appearance.  HENT:     Head: Normocephalic.     Right Ear: Tympanic membrane, ear canal and external ear normal.     Left Ear: Tympanic membrane, ear canal and external ear normal.     Nose: Congestion present. No rhinorrhea.      Mouth/Throat:     Mouth: Mucous membranes are moist.     Pharynx: No posterior oropharyngeal erythema.  Eyes:     Extraocular Movements: Extraocular movements intact.     Conjunctiva/sclera: Conjunctivae normal.     Pupils: Pupils are equal, round, and reactive to light.  Cardiovascular:     Rate and Rhythm: Normal rate and regular rhythm.     Pulses: Normal pulses.     Heart sounds: Normal heart sounds.  Pulmonary:     Effort: Pulmonary effort is normal. No respiratory distress.     Breath sounds: Normal breath sounds. No stridor. No wheezing, rhonchi or rales.  Abdominal:     General: Bowel sounds are normal.     Palpations: Abdomen is soft.     Tenderness: There is no abdominal tenderness.  Musculoskeletal:     Cervical back: Normal range of motion.  Lymphadenopathy:     Cervical: No cervical adenopathy.  Skin:    General: Skin is warm and dry.  Neurological:     General: No focal deficit present.     Mental Status: He is alert and oriented to person, place, and time.  Psychiatric:        Mood and Affect: Mood normal.        Behavior: Behavior normal.      UC Treatments / Results  Labs (all labs ordered are listed, but only abnormal results are displayed) Labs Reviewed - No data to display  EKG   Radiology No results found.  Procedures Procedures (including critical care time)  Medications Ordered in UC Medications - No data to display  Initial Impression / Assessment and Plan / UC Course  I have reviewed the triage vital signs and the nursing notes.  Pertinent labs & imaging results that were available during my care of the patient were reviewed by me and considered in my medical decision making (see chart for details).  The patient is well-appearing, he is in no acute distress, vital signs are stable.  Suspect a viral upper respiratory infection with cough.  COVID test was declined by patient as it may not be warranted given the timing of the result.   Supportive care recommendations were provided to the patient to include increasing fluids, allowing for plenty of  rest, and use of a humidifier in the bedroom at nighttime during sleep.  Bromfed-DM was prescribed to help with his cough.  Patient was given strict follow-up precautions along with discussion regarding viral etiology.  Patient verbalizes understanding.  All questions were answered.  Patient is stable for discharge.  Final Clinical Impressions(s) / UC Diagnoses   Final diagnoses:  Viral upper respiratory tract infection with cough     Discharge Instructions      Take medication as prescribed. Increase fluids and allow for plenty of rest. Recommend Tylenol or ibuprofen as needed for pain, fever, or general discomfort. Warm salt water gargles 3-4 times daily to help with throat pain or discomfort. Recommend using a humidifier at bedtime during sleep to help with cough and nasal congestion. Sleep elevated on 2 pillows while cough symptoms persist. As discussed, a viral illness can last anywhere from 10 to 14 days, symptoms should improve between days 5 and 7 since her symptoms onset.  If symptoms improve, but then they suddenly worsen, please follow-up in this clinic for further evaluation.  Also if symptoms do not improve after 10 to 14 days, re-evaluation in this clinic or with your primary care physician.. Follow-up as needed.      ED Prescriptions     Medication Sig Dispense Auth. Provider   brompheniramine-pseudoephedrine-DM 30-2-10 MG/5ML syrup Take 5 mLs by mouth 4 (four) times daily as needed. 140 mL Seona Clemenson-Warren, Sadie Haber, NP      PDMP not reviewed this encounter.   Abran Cantor, NP 03/28/22 [phone]

## 2022-04-23 DIAGNOSIS — Z1211 Encounter for screening for malignant neoplasm of colon: Secondary | ICD-10-CM | POA: Diagnosis not present

## 2022-04-23 DIAGNOSIS — K648 Other hemorrhoids: Secondary | ICD-10-CM | POA: Diagnosis not present

## 2022-04-23 DIAGNOSIS — K621 Rectal polyp: Secondary | ICD-10-CM | POA: Diagnosis not present

## 2022-04-23 DIAGNOSIS — D128 Benign neoplasm of rectum: Secondary | ICD-10-CM | POA: Diagnosis not present

## 2022-04-30 IMAGING — MR MR LUMBAR SPINE W/O CM
5 series · 31 of 48 positions shown · non-contrast
Comparison: Plain films lumbar spine 06/08/2021.

CLINICAL DATA: Low back pain and left leg aching for 12 months.

EXAM:
MRI LUMBAR SPINE WITHOUT CONTRAST
TECHNIQUE: Multiplanar, multisequence MR imaging of the lumbar spine was
performed. No intravenous contrast was administered.

[Series 5: T2 · sagittal · 4.0mm · 0.68mm/px · 6 of 15 slices shown (1 of 2)]
[im 1/15]
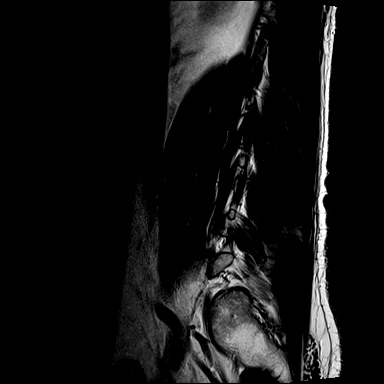
[im 3/15]
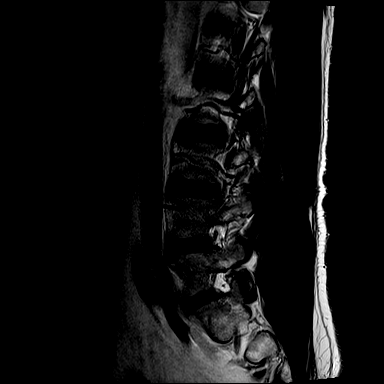
[im 6/15]
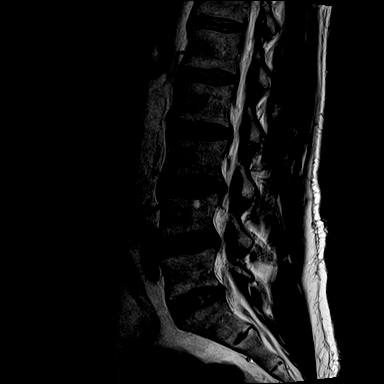
[im 9/15]
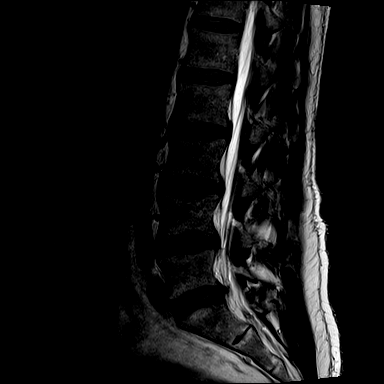
[im 12/15]
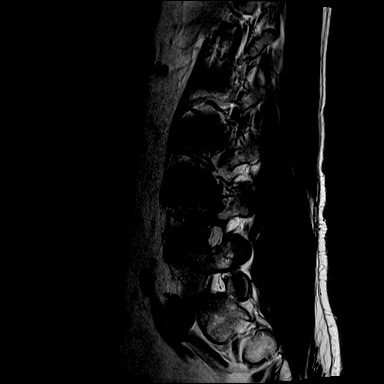
[im 15/15]
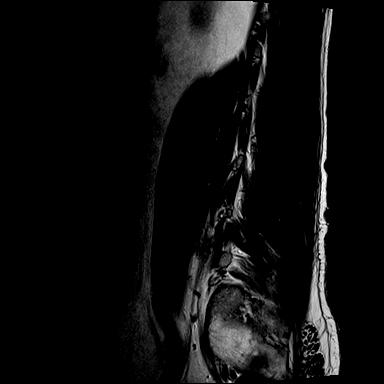

[Series 6: T1 · sagittal · 4.0mm · 0.81mm/px · 6 of 15 slices shown (1 of 2)]
[im 1/15]
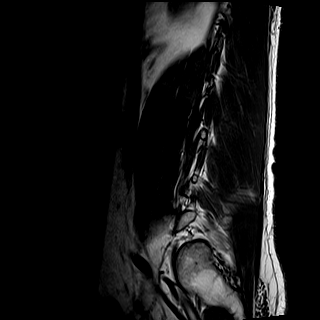
[im 3/15]
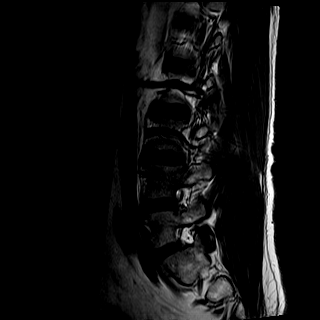
[im 6/15]
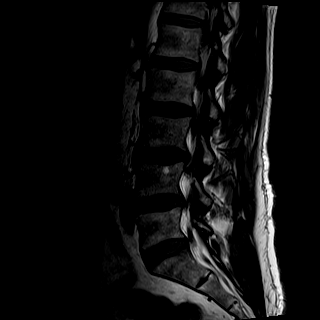
[im 9/15]
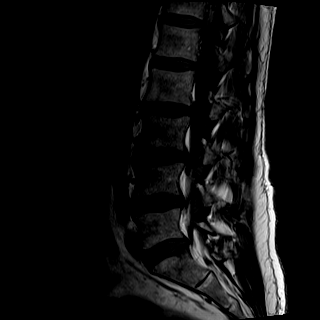
[im 12/15]
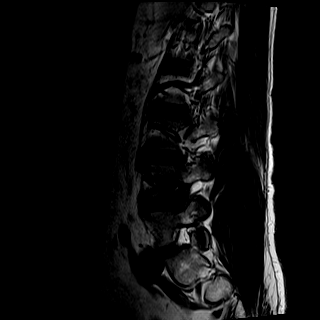
[im 15/15]
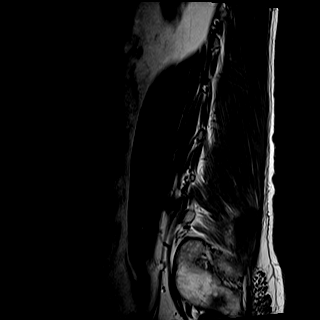

[Series 7: STIR · sagittal · 4.0mm · 0.51mm/px · 1 of 15 slices shown]
[im 1/15]
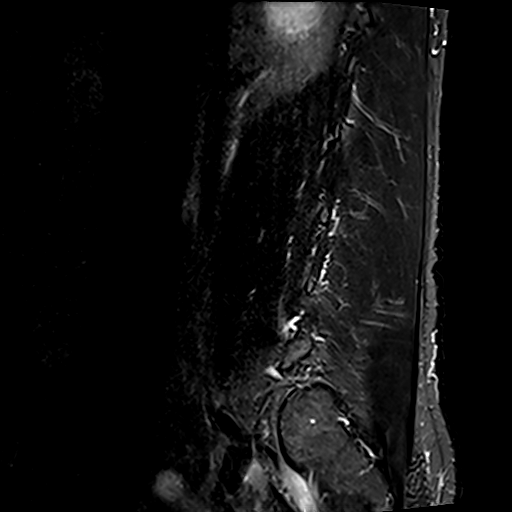

[Series 8: T2 · axial · 4.0mm · 0.70mm/px · z∈[-120,+81]mm · 9 of 35 slices shown (2 of 2)]
[im 1/35]
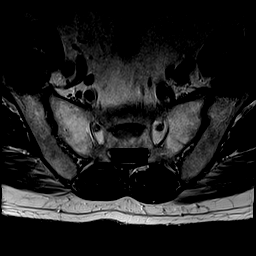
[im 5/35]
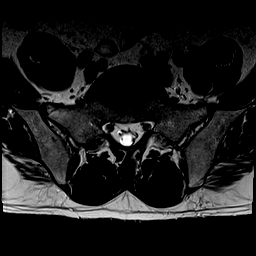
[im 10/35]
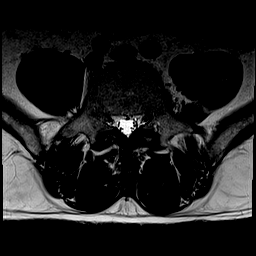
[im 15/35]
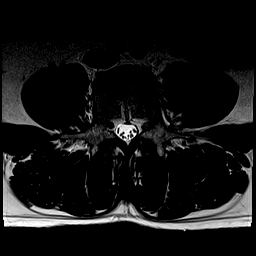
[im 18/35]
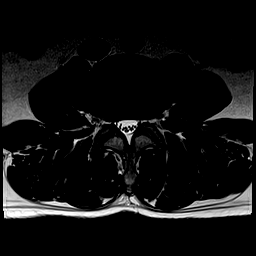
[im 20/35]
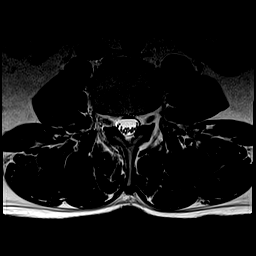
[im 25/35]
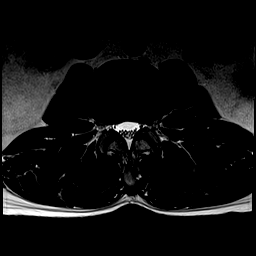
[im 30/35]
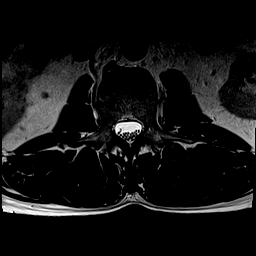
[im 35/35]
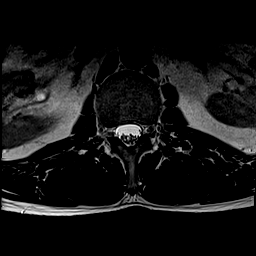

[Series 9: T1 · axial · 4.0mm · 0.35mm/px · z∈[-120,+81]mm · 9 of 35 slices shown (2 of 2)]
[im 1/35]
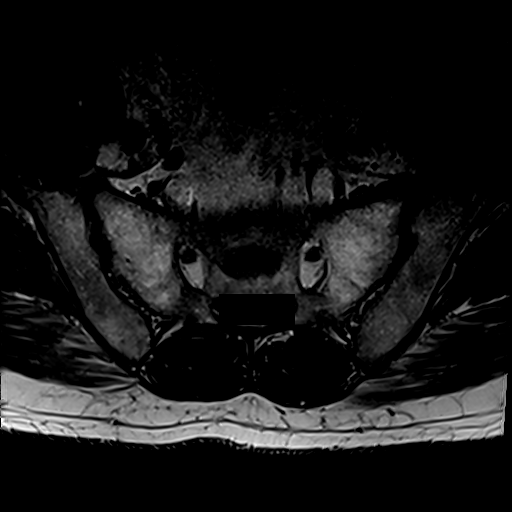
[im 5/35]
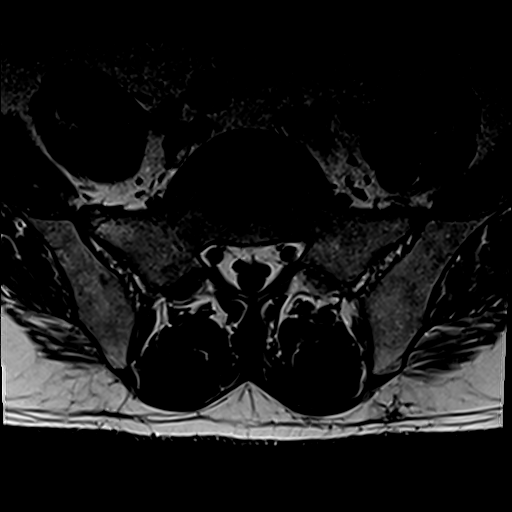
[im 10/35]
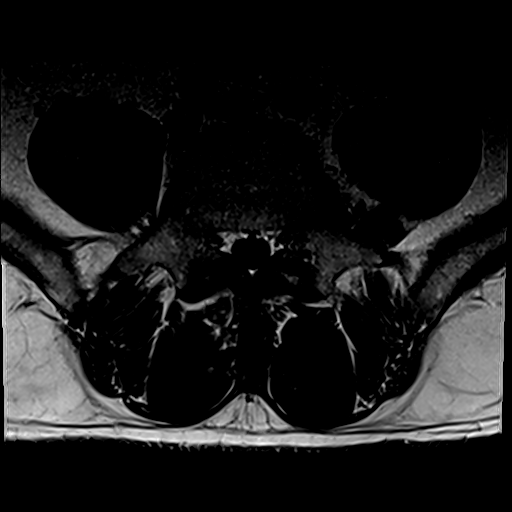
[im 15/35]
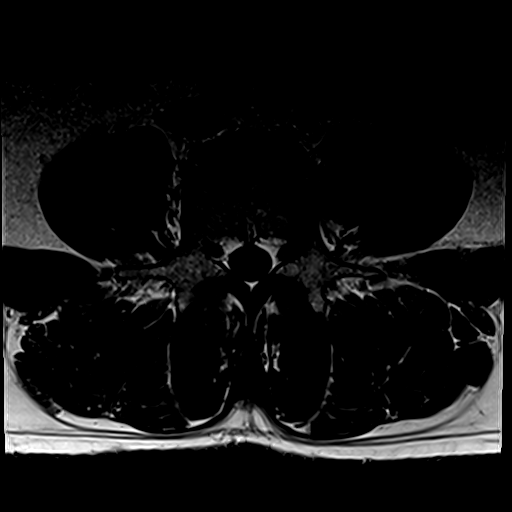
[im 18/35]
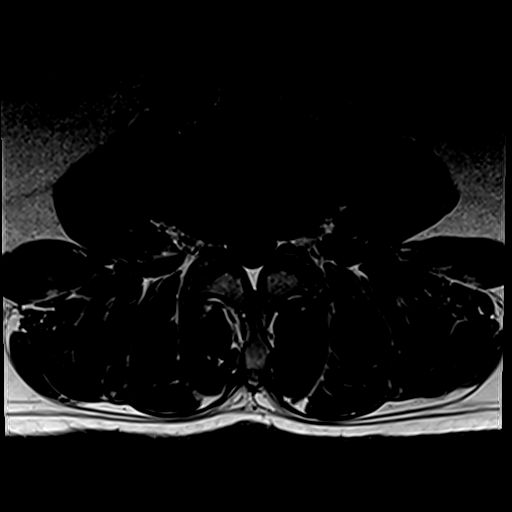
[im 20/35]
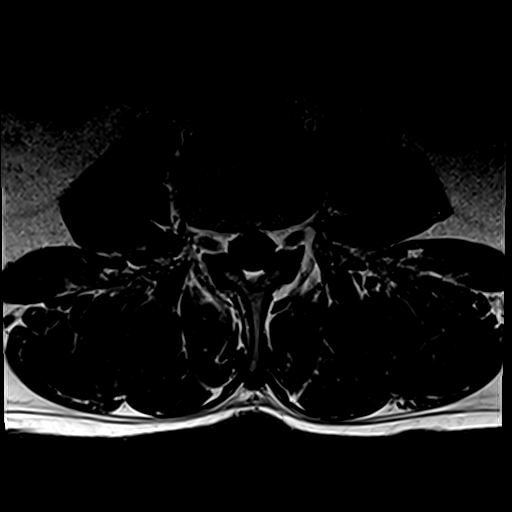
[im 25/35]
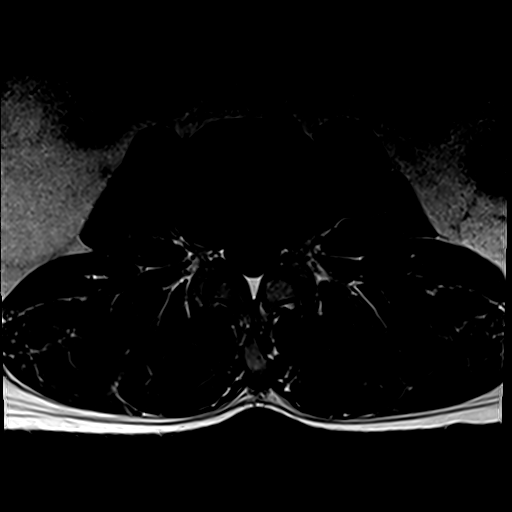
[im 30/35]
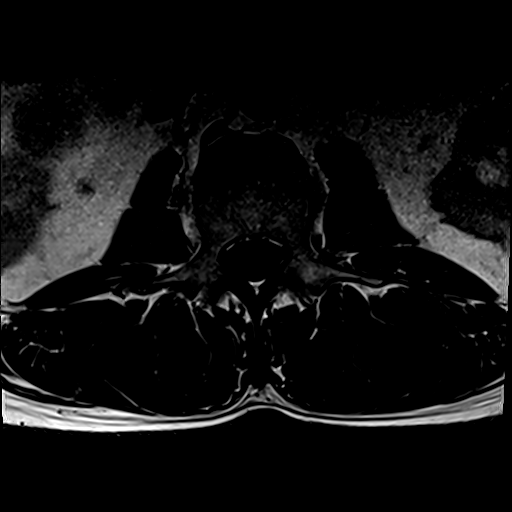
[im 35/35]
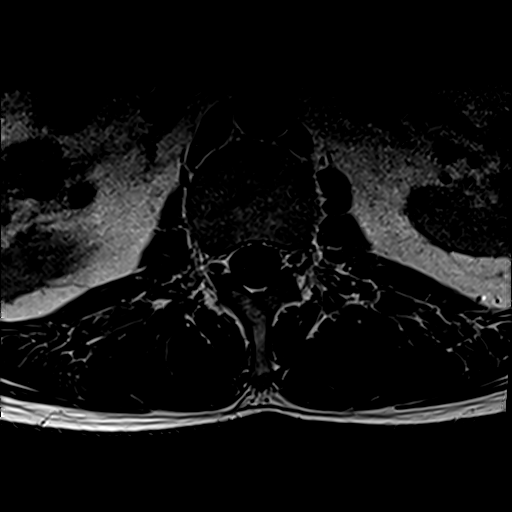

[31 of 48 positions shown; findings below may reference images not displayed]

FINDINGS: Segmentation:  Standard.

Alignment:  Maintained.

Vertebrae:  No fracture, evidence of discitis, or bone lesion.

Conus medullaris and cauda equina: Conus extends to the T12-L1
level. Conus and cauda equina appear normal.

Paraspinal and other soft tissues: Negative.

Disc levels:

T12-L1 is imaged in the sagittal plane only and negative.

L1-2: Negative.

L2-3: There is an annular fissure and small protrusion in the left
foramen causing mild left foraminal stenosis. The right foramen and
central canal are open.

L3-4: Shallow disc bulge and endplate spur. Annular fissure and tiny
protrusion in the right foramen noted. There is mild right foraminal
stenosis. The central canal and left foramen are open.

L4-5: There is a large disc protrusion in the periphery of the left
foramen extending into the left paravertebral space. There is severe
left foraminal stenosis and compression of the left L4 root. The
central canal and right foramen are open.

L5-S1: Minimal disc bulge without stenosis.
IMPRESSION: Large foraminal and extraforaminal protrusion on the left L4-5
causes severe foraminal stenosis and compression of the left L4
root.

Mild left foraminal stenosis at L2-3 due to a small disc protrusion
with an annular fissure.

Mild right foraminal narrowing L3-4.

## 2022-05-05 DIAGNOSIS — Z125 Encounter for screening for malignant neoplasm of prostate: Secondary | ICD-10-CM | POA: Diagnosis not present

## 2022-05-05 DIAGNOSIS — R7303 Prediabetes: Secondary | ICD-10-CM | POA: Diagnosis not present

## 2022-05-05 DIAGNOSIS — I10 Essential (primary) hypertension: Secondary | ICD-10-CM | POA: Diagnosis not present

## 2022-05-14 ENCOUNTER — Other Ambulatory Visit (HOSPITAL_COMMUNITY): Payer: Self-pay | Admitting: Family Medicine

## 2022-05-14 DIAGNOSIS — R7303 Prediabetes: Secondary | ICD-10-CM | POA: Diagnosis not present

## 2022-05-14 DIAGNOSIS — L989 Disorder of the skin and subcutaneous tissue, unspecified: Secondary | ICD-10-CM | POA: Diagnosis not present

## 2022-05-14 DIAGNOSIS — M545 Low back pain, unspecified: Secondary | ICD-10-CM | POA: Diagnosis not present

## 2022-05-14 DIAGNOSIS — R053 Chronic cough: Secondary | ICD-10-CM

## 2022-05-14 DIAGNOSIS — Z0001 Encounter for general adult medical examination with abnormal findings: Secondary | ICD-10-CM | POA: Diagnosis not present

## 2022-05-14 DIAGNOSIS — I10 Essential (primary) hypertension: Secondary | ICD-10-CM | POA: Diagnosis not present

## 2022-05-14 DIAGNOSIS — R0683 Snoring: Secondary | ICD-10-CM | POA: Diagnosis not present

## 2022-05-14 DIAGNOSIS — K219 Gastro-esophageal reflux disease without esophagitis: Secondary | ICD-10-CM | POA: Diagnosis not present

## 2022-05-14 DIAGNOSIS — R7989 Other specified abnormal findings of blood chemistry: Secondary | ICD-10-CM | POA: Diagnosis not present

## 2022-05-20 ENCOUNTER — Ambulatory Visit (HOSPITAL_COMMUNITY)
Admission: RE | Admit: 2022-05-20 | Discharge: 2022-05-20 | Disposition: A | Discharge status: Home / Self Care | Payer: BC Managed Care – PPO | Source: Ambulatory Visit | Attending: Family Medicine | Admitting: Family Medicine

## 2022-05-20 DIAGNOSIS — R059 Cough, unspecified: Secondary | ICD-10-CM | POA: Diagnosis not present

## 2022-05-20 DIAGNOSIS — R053 Chronic cough: Secondary | ICD-10-CM | POA: Insufficient documentation

## 2022-05-20 DIAGNOSIS — R0600 Dyspnea, unspecified: Secondary | ICD-10-CM | POA: Diagnosis not present

## 2022-06-02 ENCOUNTER — Other Ambulatory Visit (HOSPITAL_COMMUNITY): Payer: Self-pay | Admitting: Radiology

## 2022-06-02 DIAGNOSIS — R053 Chronic cough: Secondary | ICD-10-CM

## 2022-06-03 ENCOUNTER — Encounter: Payer: Self-pay | Admitting: Radiology

## 2022-06-16 ENCOUNTER — Other Ambulatory Visit (HOSPITAL_BASED_OUTPATIENT_CLINIC_OR_DEPARTMENT_OTHER): Payer: Self-pay

## 2022-06-16 DIAGNOSIS — R0683 Snoring: Secondary | ICD-10-CM

## 2022-11-08 DIAGNOSIS — L573 Poikiloderma of Civatte: Secondary | ICD-10-CM | POA: Diagnosis not present

## 2022-11-08 DIAGNOSIS — D1801 Hemangioma of skin and subcutaneous tissue: Secondary | ICD-10-CM | POA: Diagnosis not present

## 2022-11-08 DIAGNOSIS — L821 Other seborrheic keratosis: Secondary | ICD-10-CM | POA: Diagnosis not present

## 2023-11-25 ENCOUNTER — Encounter: Payer: Self-pay | Admitting: Radiology

## 2024-01-18 DIAGNOSIS — Z125 Encounter for screening for malignant neoplasm of prostate: Secondary | ICD-10-CM | POA: Diagnosis not present

## 2024-01-18 DIAGNOSIS — Z0000 Encounter for general adult medical examination without abnormal findings: Secondary | ICD-10-CM | POA: Diagnosis not present

## 2024-01-18 DIAGNOSIS — Z131 Encounter for screening for diabetes mellitus: Secondary | ICD-10-CM | POA: Diagnosis not present

## 2024-02-03 ENCOUNTER — Other Ambulatory Visit (HOSPITAL_COMMUNITY): Payer: Self-pay | Admitting: Internal Medicine

## 2024-02-03 DIAGNOSIS — M19011 Primary osteoarthritis, right shoulder: Secondary | ICD-10-CM | POA: Diagnosis not present

## 2024-02-03 DIAGNOSIS — I10 Essential (primary) hypertension: Secondary | ICD-10-CM | POA: Diagnosis not present

## 2024-02-03 DIAGNOSIS — M545 Low back pain, unspecified: Secondary | ICD-10-CM | POA: Diagnosis not present

## 2024-02-03 DIAGNOSIS — R7989 Other specified abnormal findings of blood chemistry: Secondary | ICD-10-CM | POA: Diagnosis not present

## 2024-02-03 DIAGNOSIS — R7303 Prediabetes: Secondary | ICD-10-CM | POA: Diagnosis not present

## 2024-02-03 DIAGNOSIS — Z0001 Encounter for general adult medical examination with abnormal findings: Secondary | ICD-10-CM | POA: Diagnosis not present

## 2024-02-06 ENCOUNTER — Encounter: Payer: Self-pay | Admitting: Radiology

## 2024-02-27 ENCOUNTER — Ambulatory Visit (HOSPITAL_COMMUNITY)
Admission: RE | Admit: 2024-02-27 | Discharge: 2024-02-27 | Disposition: A | Discharge status: Home / Self Care | Payer: Self-pay | Source: Ambulatory Visit | Attending: Internal Medicine | Admitting: Internal Medicine

## 2024-02-27 DIAGNOSIS — I10 Essential (primary) hypertension: Secondary | ICD-10-CM | POA: Insufficient documentation
